# Patient Record
Sex: Female | Born: 1970 | Race: White | Hispanic: No | Marital: Married | Smoking: Never smoker
Health system: Southern US, Community
[De-identification: ages and names within clinical notes are randomized; demographics above are authoritative.]

## PROBLEM LIST (undated history)

## (undated) DIAGNOSIS — J45990 Exercise induced bronchospasm: Secondary | ICD-10-CM

## (undated) DIAGNOSIS — N921 Excessive and frequent menstruation with irregular cycle: Secondary | ICD-10-CM

## (undated) DIAGNOSIS — R102 Pelvic and perineal pain: Secondary | ICD-10-CM

## (undated) DIAGNOSIS — D251 Intramural leiomyoma of uterus: Secondary | ICD-10-CM

## (undated) DIAGNOSIS — F32A Depression, unspecified: Secondary | ICD-10-CM

## (undated) DIAGNOSIS — D509 Iron deficiency anemia, unspecified: Secondary | ICD-10-CM

## (undated) DIAGNOSIS — F411 Generalized anxiety disorder: Secondary | ICD-10-CM

## (undated) DIAGNOSIS — R399 Unspecified symptoms and signs involving the genitourinary system: Secondary | ICD-10-CM

## (undated) DIAGNOSIS — N920 Excessive and frequent menstruation with regular cycle: Secondary | ICD-10-CM

## (undated) DIAGNOSIS — G43909 Migraine, unspecified, not intractable, without status migrainosus: Secondary | ICD-10-CM

## (undated) DIAGNOSIS — F329 Major depressive disorder, single episode, unspecified: Secondary | ICD-10-CM

## (undated) DIAGNOSIS — Q5128 Other doubling of uterus, other specified: Secondary | ICD-10-CM

## (undated) DIAGNOSIS — F419 Anxiety disorder, unspecified: Secondary | ICD-10-CM

## (undated) DIAGNOSIS — Z8489 Family history of other specified conditions: Secondary | ICD-10-CM

## (undated) DIAGNOSIS — N3010 Interstitial cystitis (chronic) without hematuria: Secondary | ICD-10-CM

## (undated) HISTORY — DX: Anxiety disorder, unspecified: F41.9

## (undated) HISTORY — DX: Excessive and frequent menstruation with regular cycle: N92.0

## (undated) HISTORY — DX: Depression, unspecified: F32.A

---

## 1999-02-27 HISTORY — PX: MASS EXCISION: SHX2000

## 2004-02-27 HISTORY — PX: KNEE ARTHROSCOPY: SUR90

## 2005-08-08 ENCOUNTER — Inpatient Hospital Stay (HOSPITAL_COMMUNITY): Admission: AD | Admit: 2005-08-08 | Discharge: 2005-08-08 | Payer: Self-pay | Admitting: Obstetrics & Gynecology

## 2005-08-09 ENCOUNTER — Inpatient Hospital Stay (HOSPITAL_COMMUNITY): Admission: AD | Admit: 2005-08-09 | Discharge: 2005-08-09 | Payer: Self-pay | Admitting: Obstetrics and Gynecology

## 2005-09-15 ENCOUNTER — Inpatient Hospital Stay (HOSPITAL_COMMUNITY): Admission: AD | Admit: 2005-09-15 | Discharge: 2005-09-18 | Payer: Self-pay | Admitting: Obstetrics and Gynecology

## 2005-09-16 ENCOUNTER — Encounter (INDEPENDENT_AMBULATORY_CARE_PROVIDER_SITE_OTHER): Payer: Self-pay | Admitting: *Deleted

## 2005-09-16 HISTORY — PX: VAGINAL DILATATION: SHX322

## 2005-09-19 ENCOUNTER — Encounter: Admission: RE | Admit: 2005-09-19 | Discharge: 2005-10-19 | Payer: Self-pay | Admitting: Obstetrics and Gynecology

## 2005-10-20 ENCOUNTER — Encounter: Admission: RE | Admit: 2005-10-20 | Discharge: 2005-11-19 | Payer: Self-pay | Admitting: Obstetrics and Gynecology

## 2005-11-30 ENCOUNTER — Ambulatory Visit (HOSPITAL_COMMUNITY): Admission: RE | Admit: 2005-11-30 | Discharge: 2005-11-30 | Payer: Self-pay | Admitting: Obstetrics and Gynecology

## 2005-12-07 ENCOUNTER — Encounter: Admission: RE | Admit: 2005-12-07 | Discharge: 2005-12-07 | Payer: Self-pay | Admitting: Obstetrics and Gynecology

## 2006-11-25 ENCOUNTER — Ambulatory Visit (HOSPITAL_COMMUNITY): Admission: RE | Admit: 2006-11-25 | Discharge: 2006-11-25 | Payer: Self-pay | Admitting: *Deleted

## 2007-05-17 IMAGING — MR MR PELVIS W/O CM
4 of 6 series · 10 of 48 positions shown · IV contrast (agent unspecified)
Comparison: none

CLINICAL DATA: Uterine anomaly seen on hysterosalpingogram.  Question septate versus bicornuate uterus.  
MRI PELVIS WITHOUT CONTRAST:
TECHNIQUE: Multiplanar, multisequence MR imaging of the pelvis was performed following the standard protocol.  No intravenous contrast was administered.

[Series 2: t2_tse_sag · sagittal · 4.0mm · 0.44mm/px · 3 of 25 slices shown]
[im 3/25]
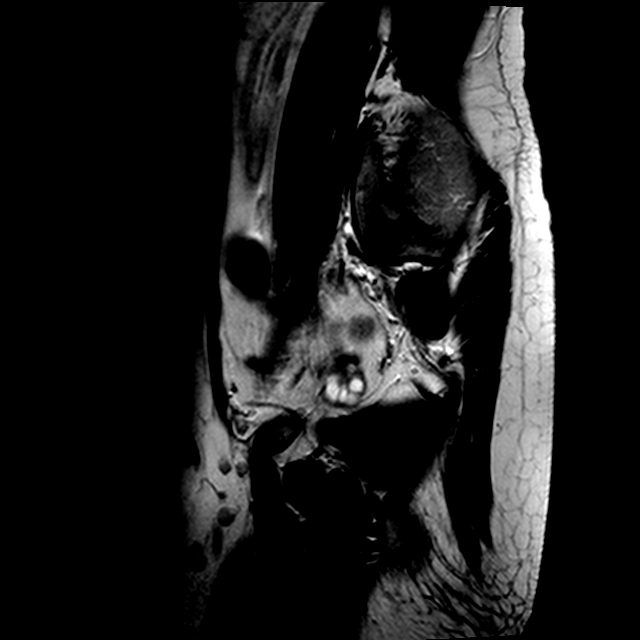
[im 14/25]
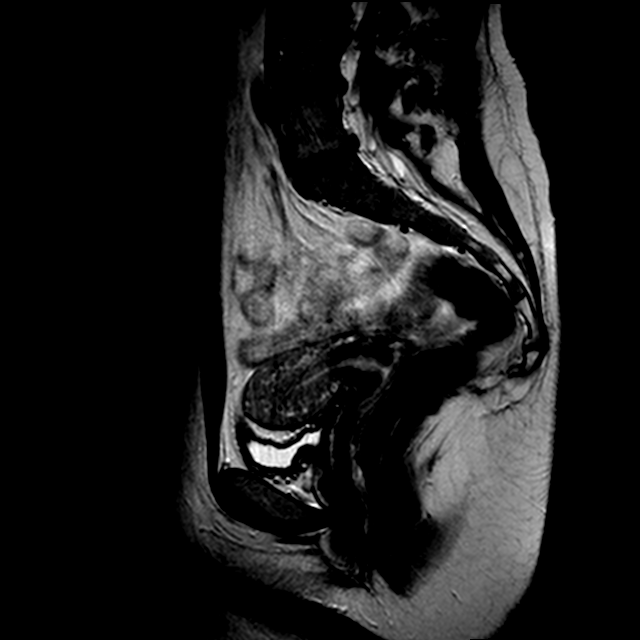
[im 22/25]
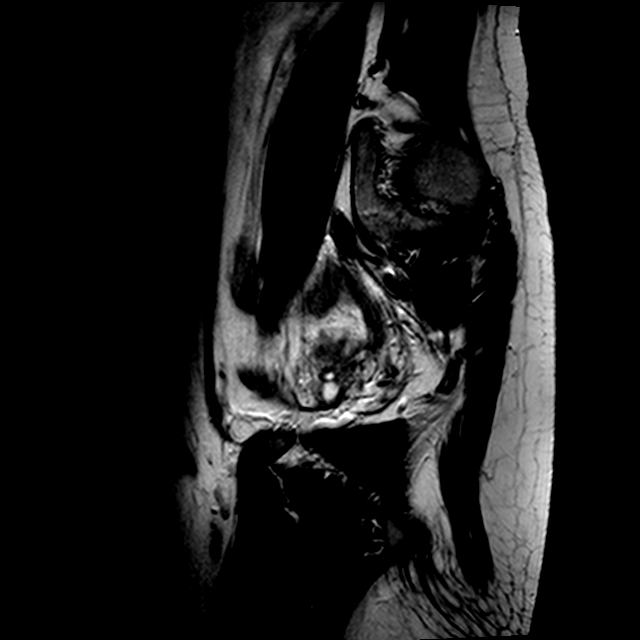

[Series 3: T1 · axial · 5.0mm · 0.36mm/px · z∈[-162,-64]mm · 3 of 20 slices shown]
[im 3/20]
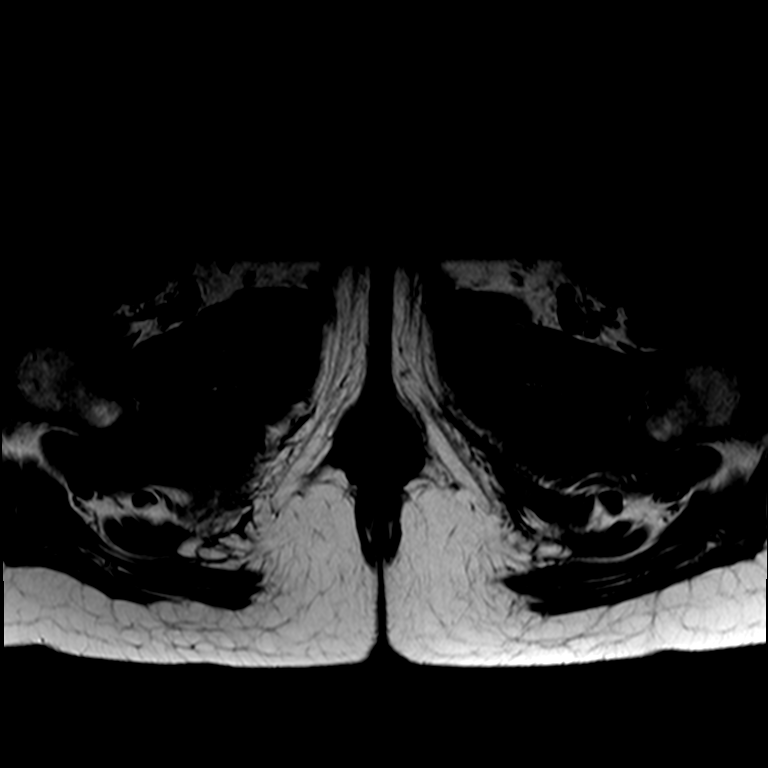
[im 11/20]
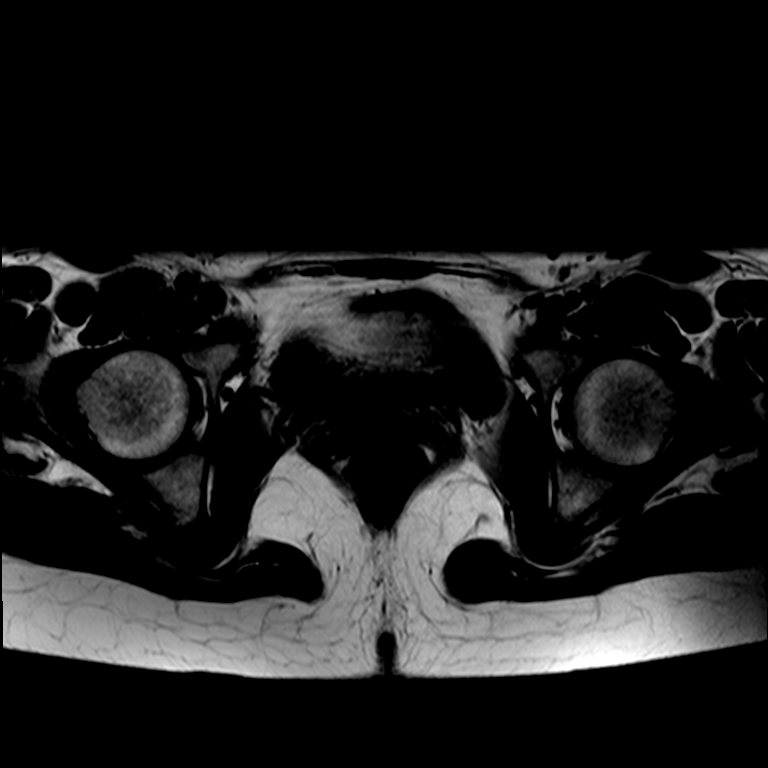
[im 17/20]
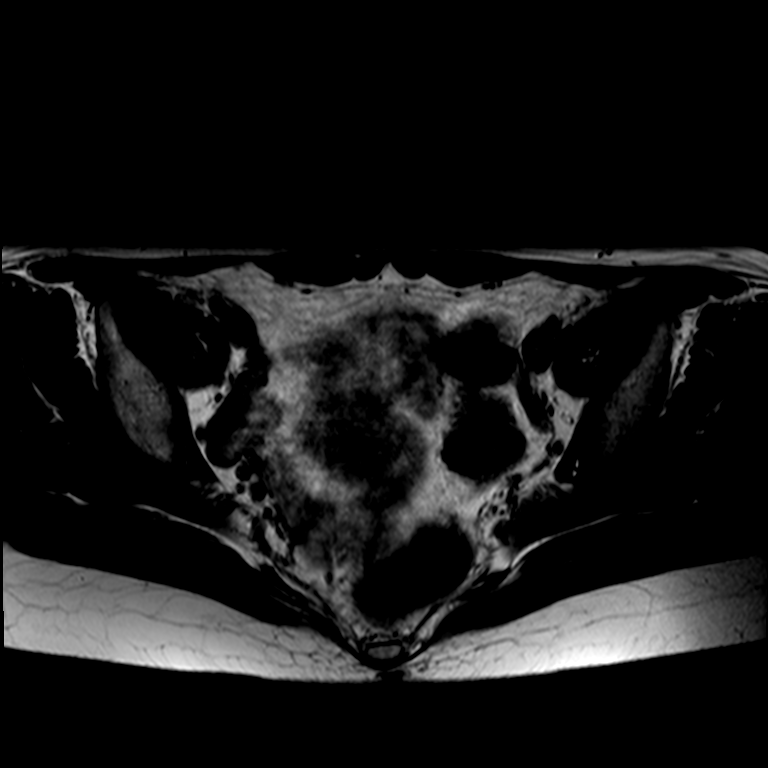

[Series 4: T1 fat-sat · axial · 5.0mm · 0.36mm/px · z∈[-155,-71]mm · 3 of 20 slices shown]
[im 4/20]
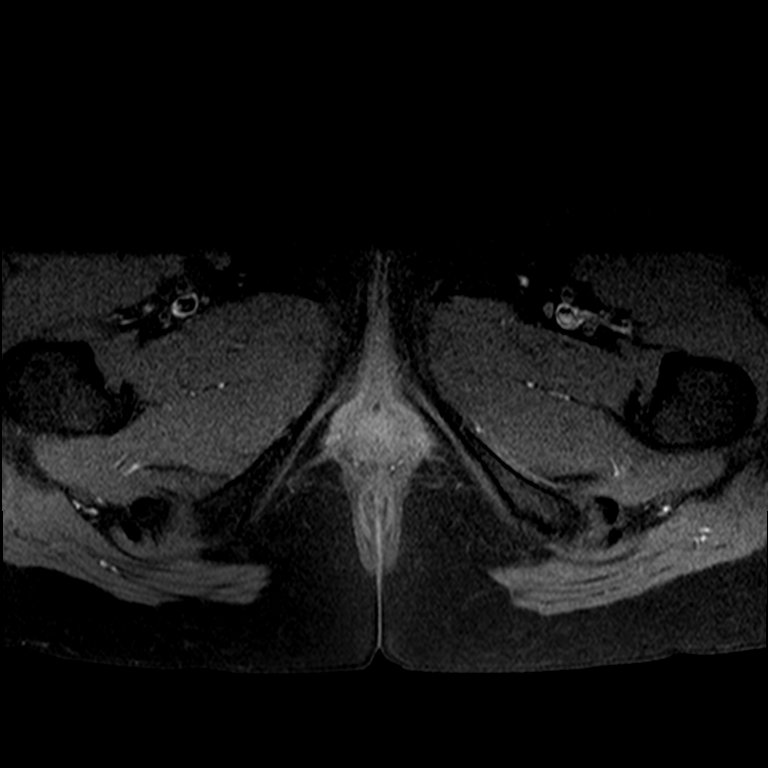
[im 10/20]
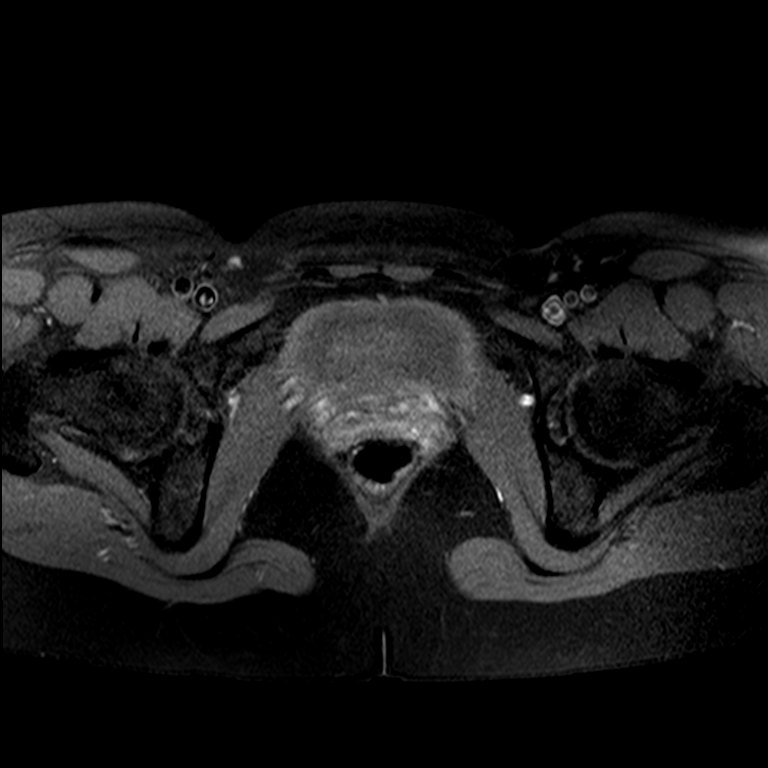
[im 16/20]
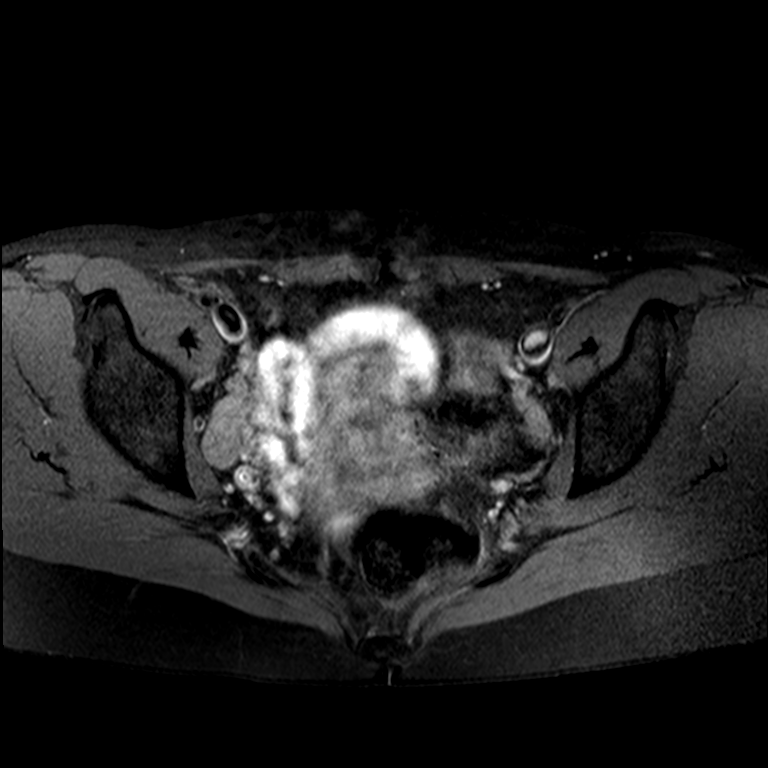

[Series 5: T2 · axial · 5.0mm · 0.36mm/px · 1 of 20 slices shown]
[im 4/20]
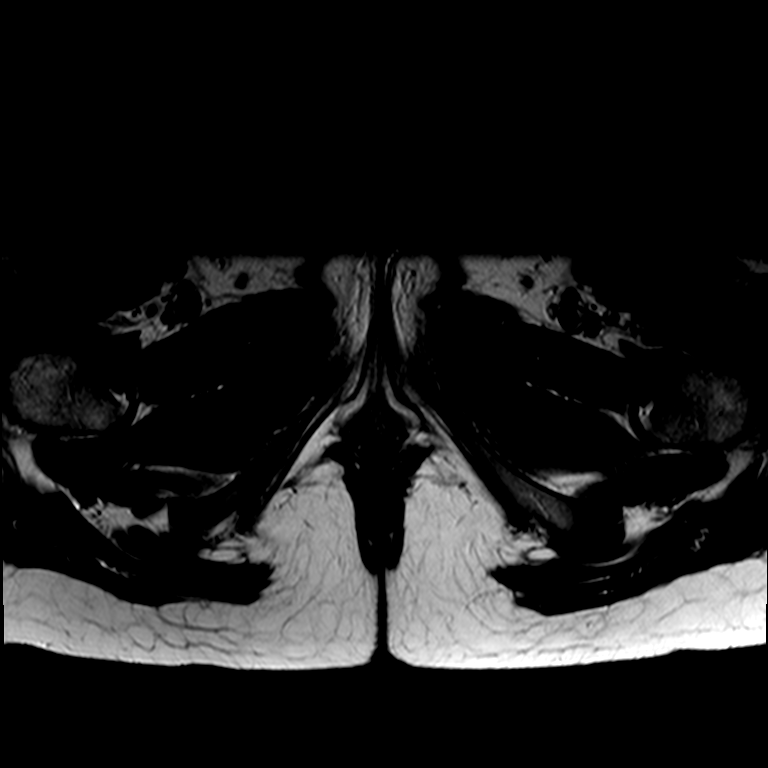

[10 of 48 positions shown; findings below may reference images not displayed]

FINDINGS: The uterus is normal in size, measuring approximately 7 cm in length x 3.5 cm in AP diameter x 6 cm in transverse diameter.  The endometrial cavity is split by a septum of myometrial tissue in the upper uterine body and fundus, however there is contiguous bridging myometrium across the fundus of the uterus.  This is consistent with a septate uterus.  Intercornual distance measures 3.7 cm.  A single cervix is seen.  No fibroids or other uterine abnormalities are identified.  
Both ovaries are normal in appearance.  There is no evidence of adnexal or other pelvic masses.
IMPRESSION: 1.  Septate uterus.  
2.  Normal ovaries.  No other pelvic pathology identified.

## 2007-11-13 ENCOUNTER — Encounter: Admission: RE | Admit: 2007-11-13 | Discharge: 2007-11-13 | Payer: Self-pay | Admitting: Specialist

## 2008-05-04 IMAGING — US US RENAL
1 series · 14 of 25 positions shown · non-contrast
Comparison: none

CLINICAL DATA: Evaluate for kidney stones. Left flank pain.
 RENAL ULTRASOUND:
TECHNIQUE: Multiple images of both kidneys were obtained.

[Series 1: us renal · 0.18mm/px · 14 of 51 slices shown]
[im 1/51]
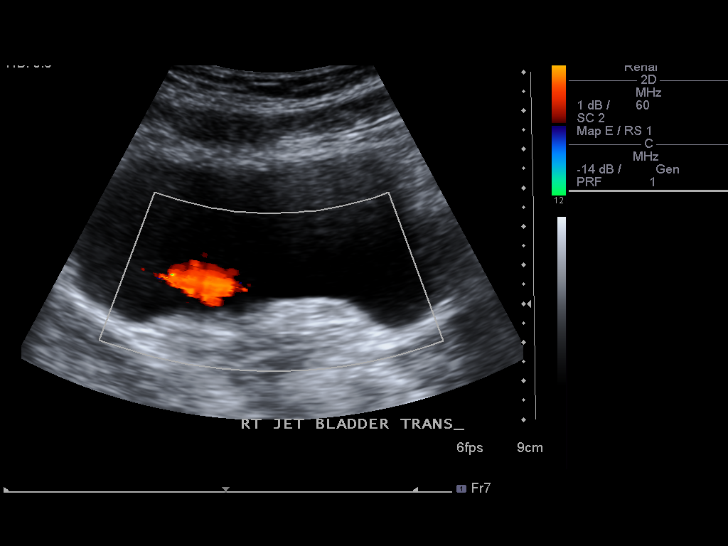
[im 5/51]
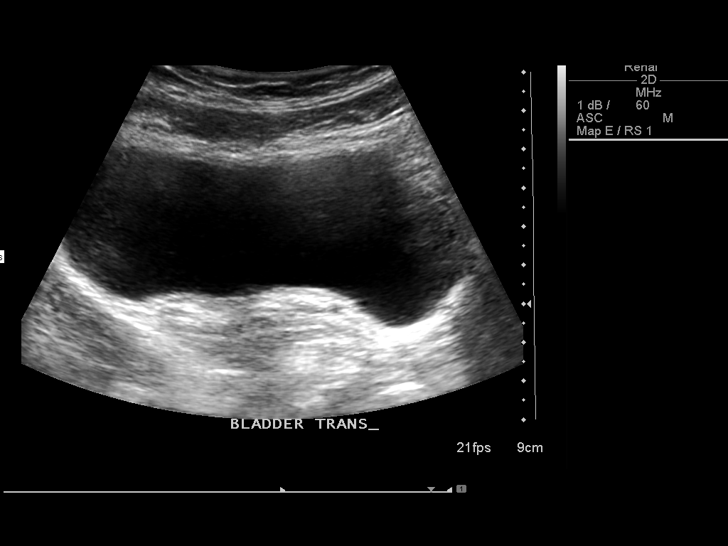
[im 9/51]
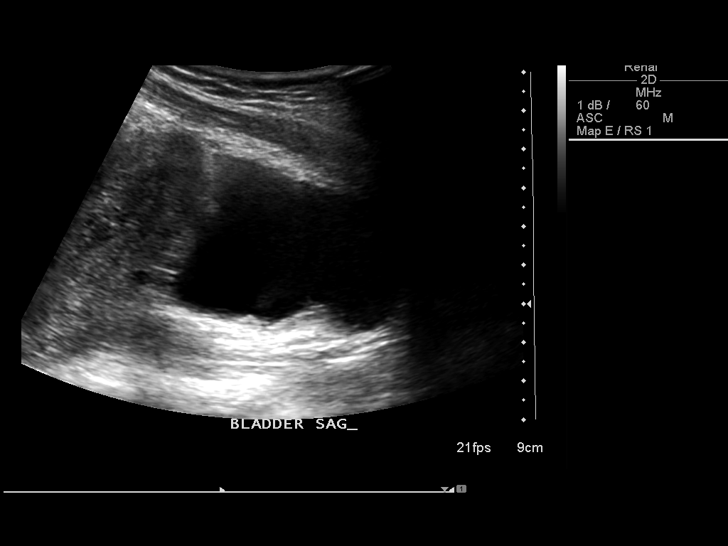
[im 13/51]
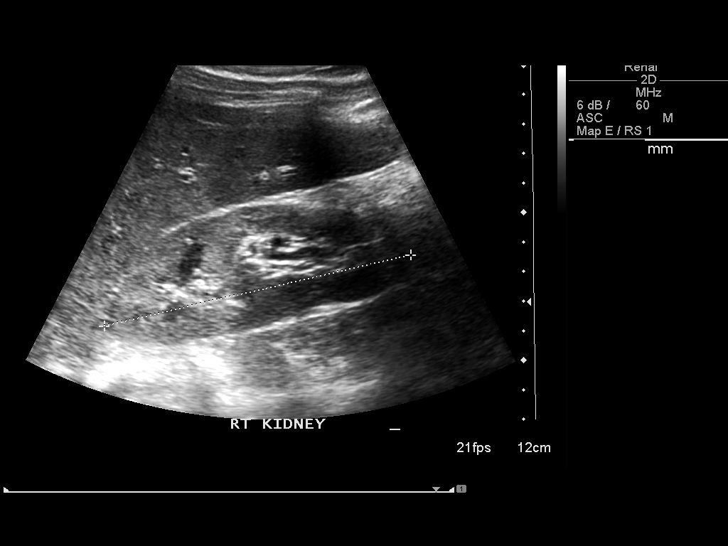
[im 17/51]
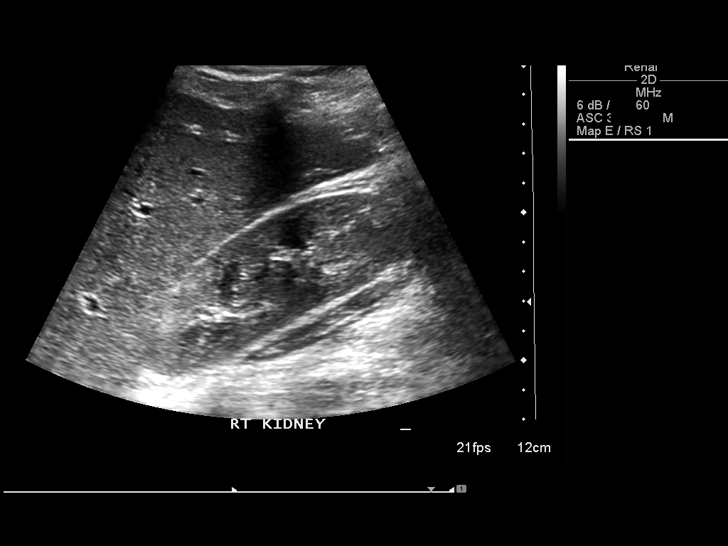
[im 19/51]
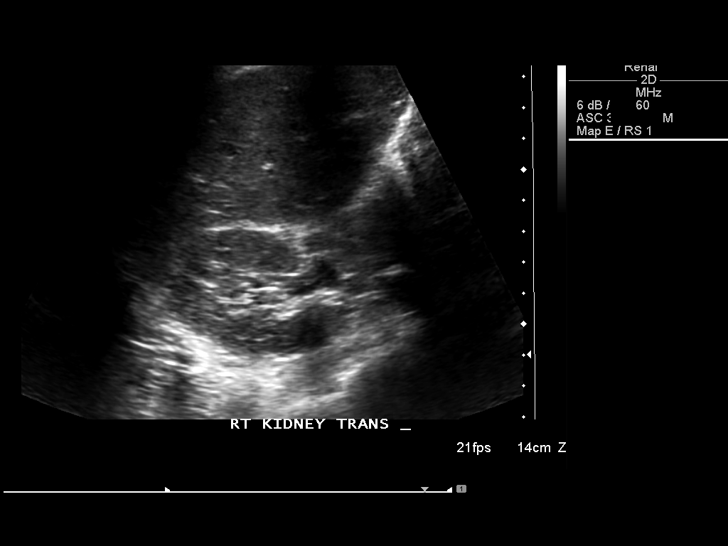
[im 23/51]
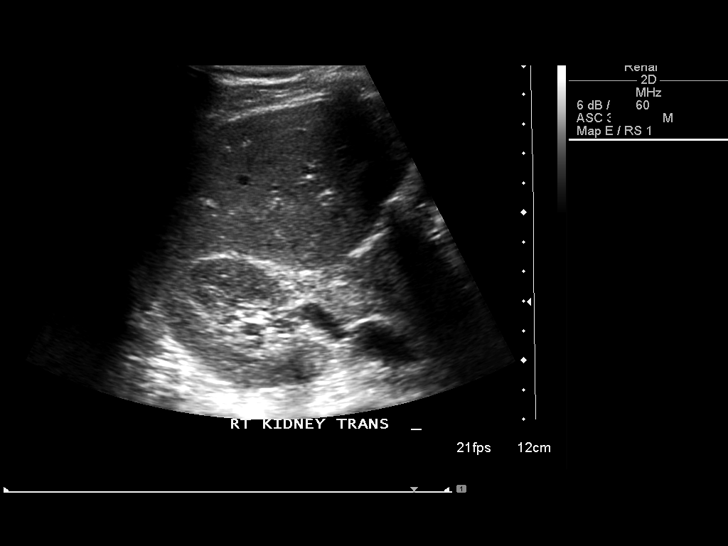
[im 28/51]
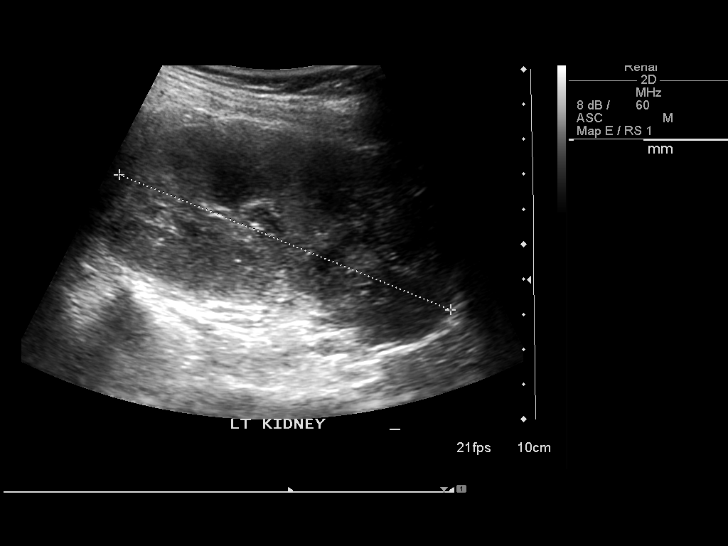
[im 32/51]
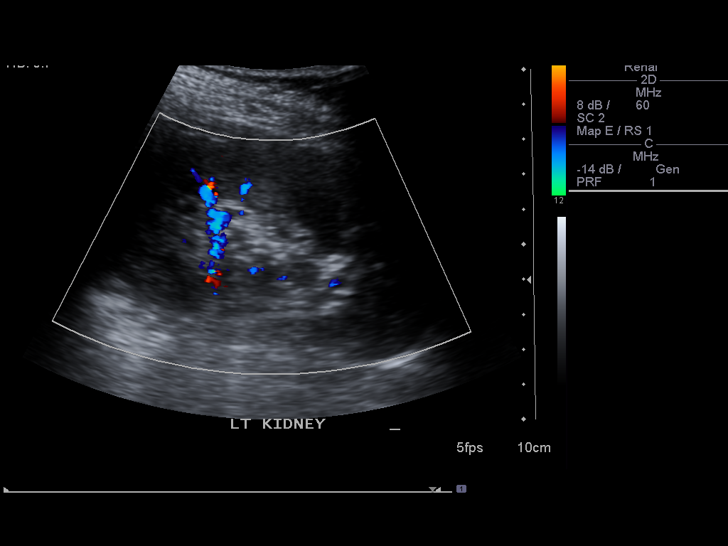
[im 34/51]
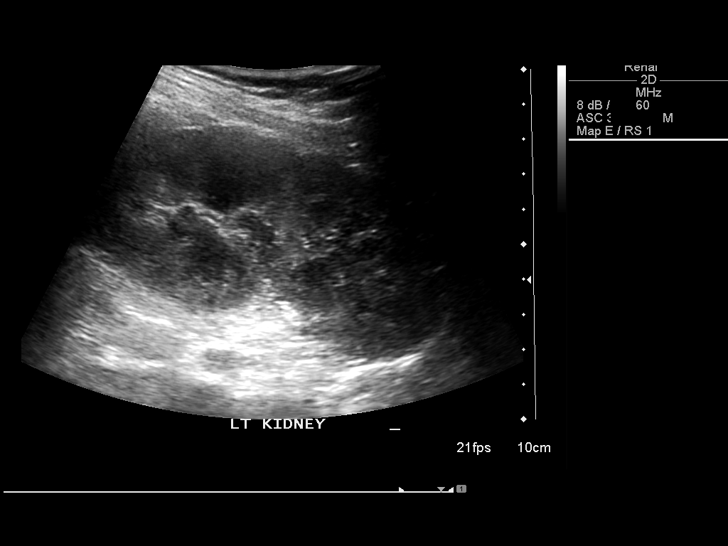
[im 38/51]
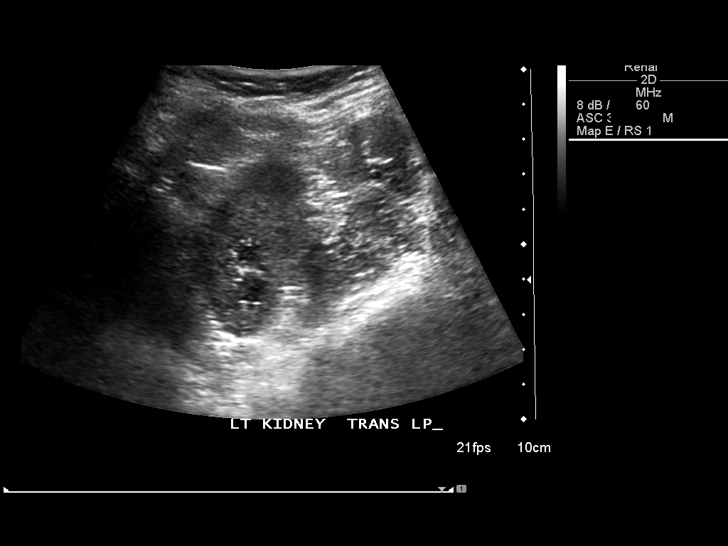
[im 42/51]
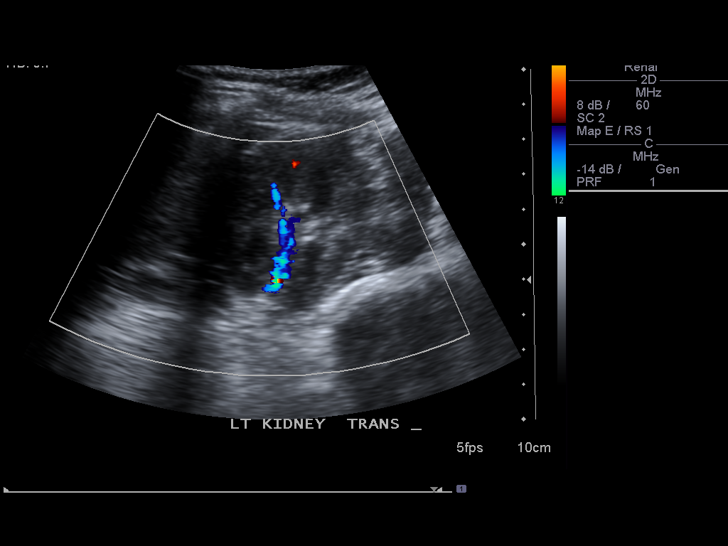
[im 46/51]
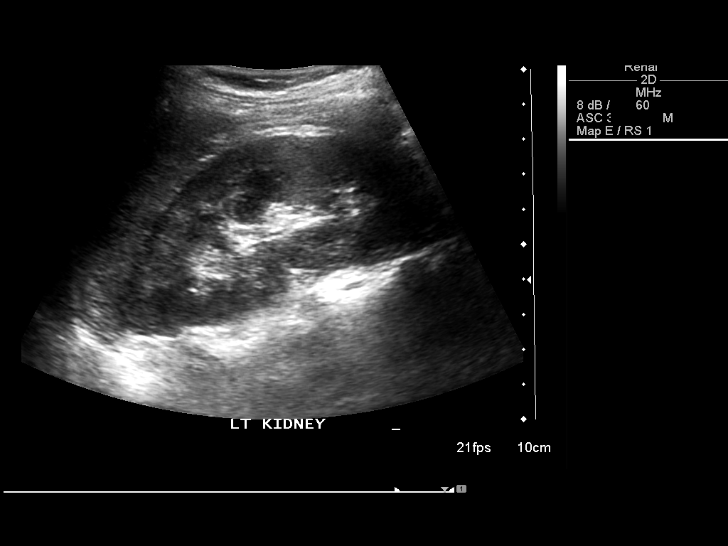
[im 51/51]
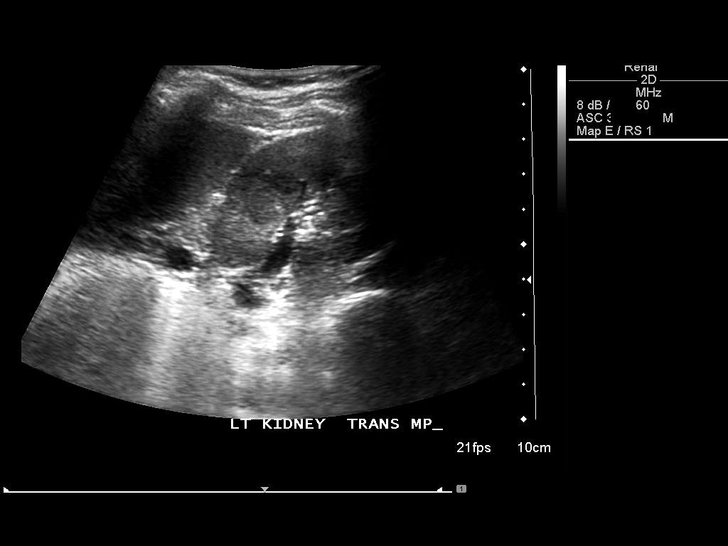

[14 of 25 positions shown; findings below may reference images not displayed]

FINDINGS: The right kidney has a sagittal length of 10.6 cm and the left kidney has a sagittal length of 10.2 cm.  No signs of hydronephrosis are seen. No focal parenchymal abnormalities are identified. Specifically, no sonographic findings are seen to suggest the presence of renal calculi. No perinephric fluid is seen. It should be noted that the patient did experience flank tenderness with scanning over the left kidney.  
 Evaluation of the bladder reveals strong bilateral ureteral jets. The bladder has a normal filled appearance.
IMPRESSION: Normal renal ultrasound. The patient did experience tenderness with scanning over the left flank area.  If clinical concern warrants, further evaluation can be undertaken with CT as this is a more sensitive way to evaluate for a small renal calculi.

## 2009-04-22 IMAGING — US US EXTREM LOW VENOUS*R*
1 series · 14 of 24 positions shown · non-contrast
Comparison: None

CLINICAL DATA: Right leg pain and swelling.  Question deep venous
thrombosis.



[Series 1: us extrem low venous*right* · 14 of 30 slices shown]
[im 1/30]
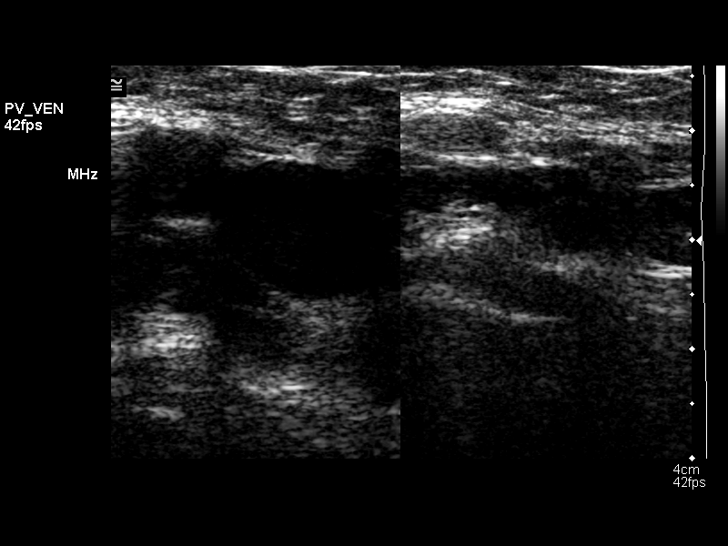
[im 3/30]
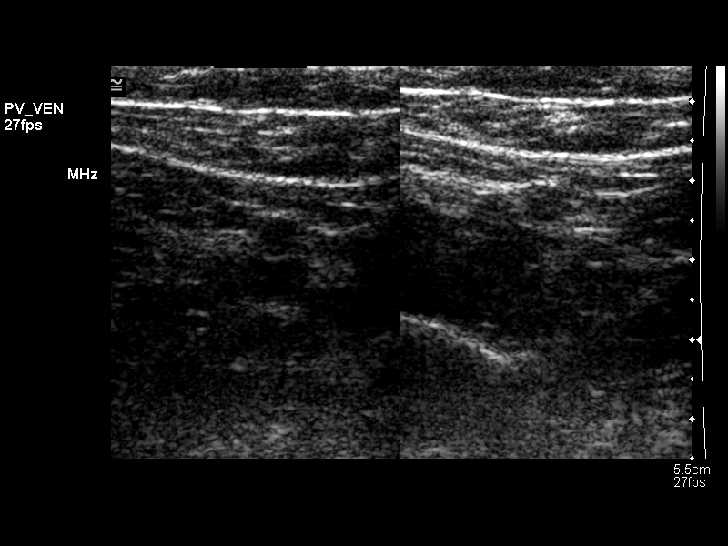
[im 6/30]
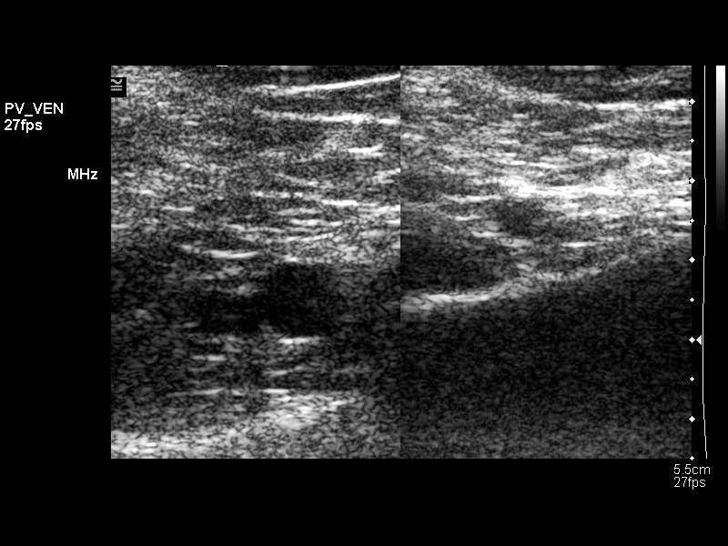
[im 8/30]
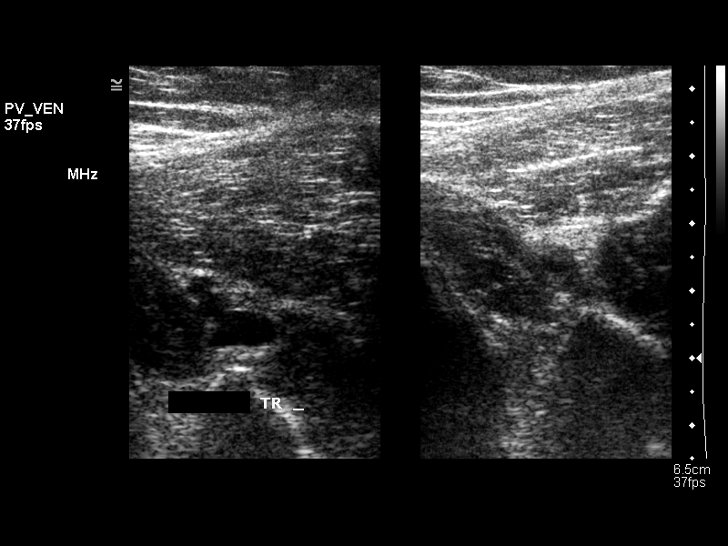
[im 9/30]
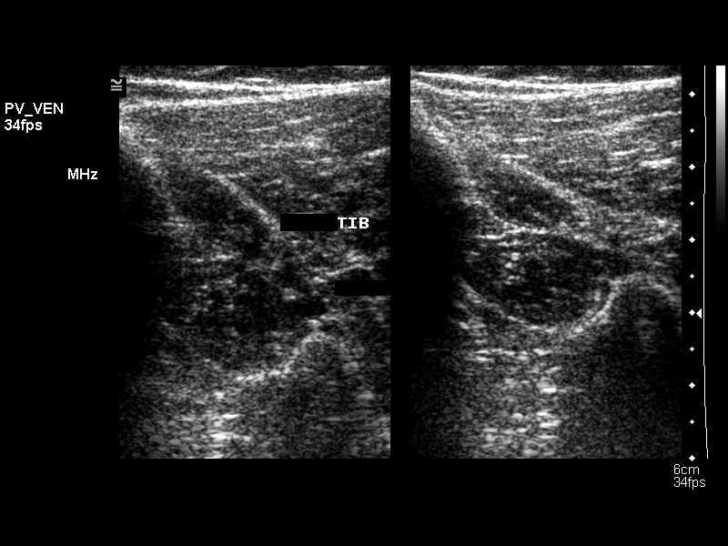
[im 12/30]
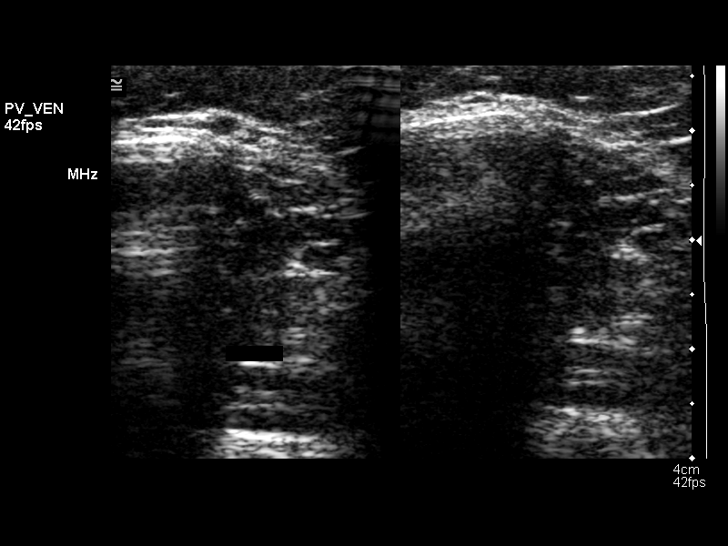
[im 14/30]
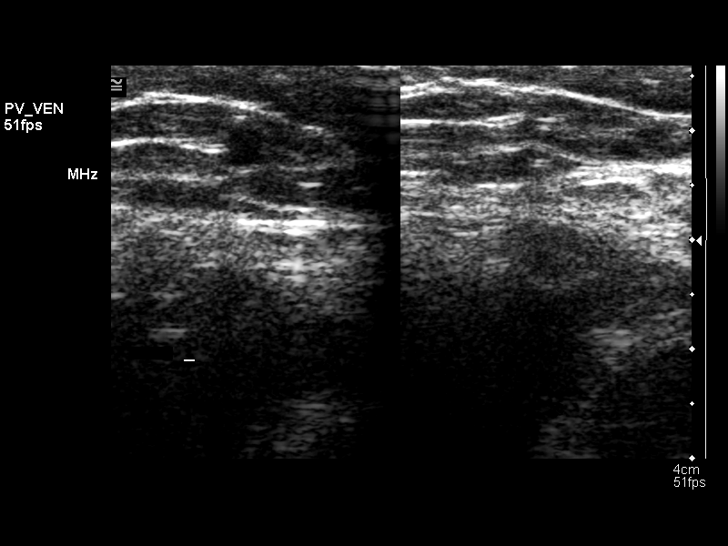
[im 16/30]
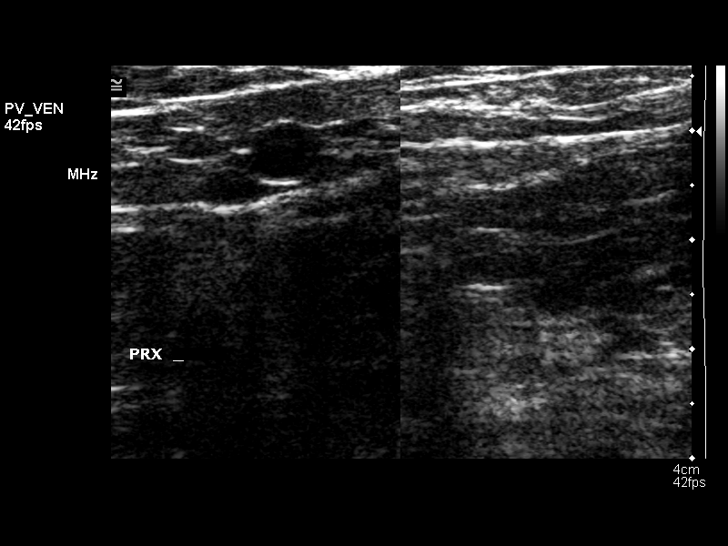
[im 18/30]
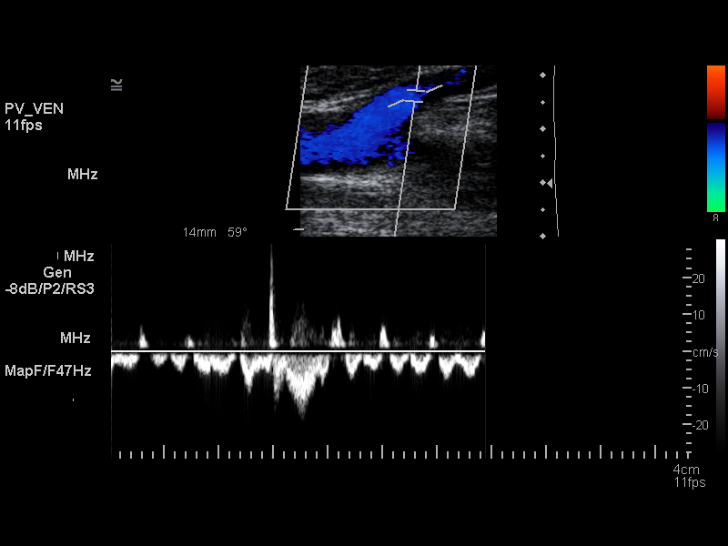
[im 21/30]
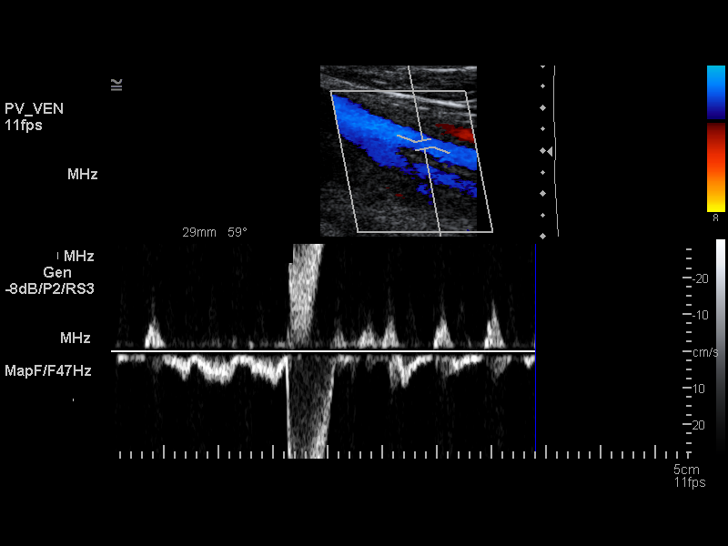
[im 23/30]
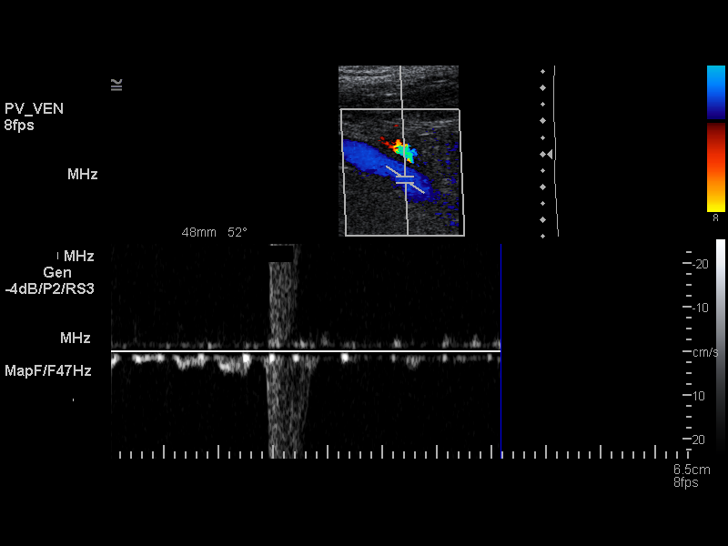
[im 24/30]
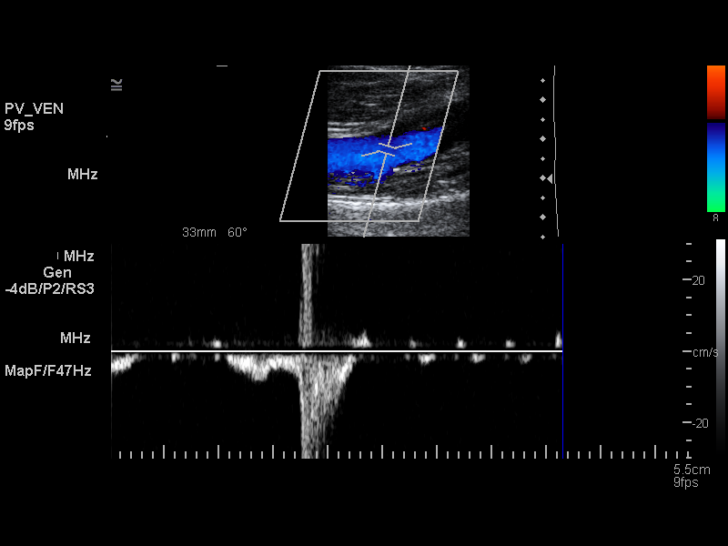
[im 27/30]
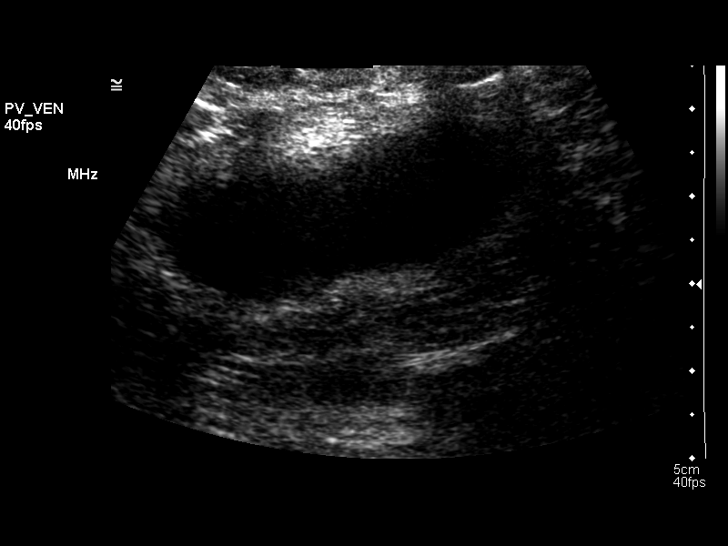
[im 30/30]
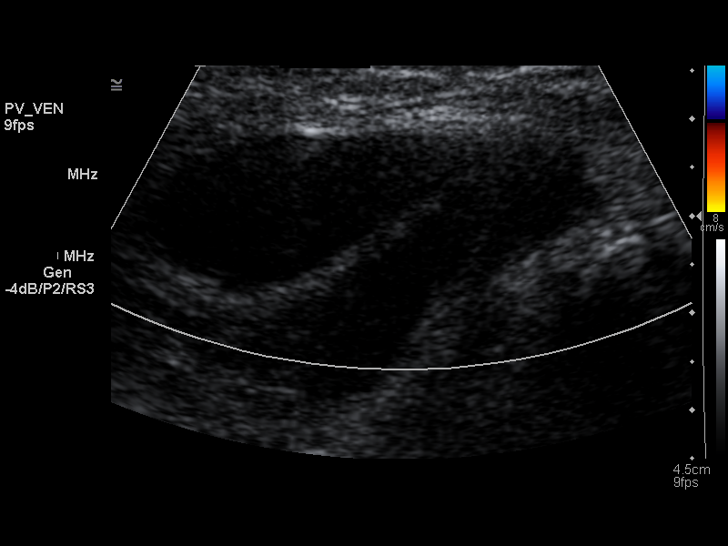

[14 of 24 positions shown; findings below may reference images not displayed]

FINDINGS: Normal compressibility of  the right common femoral,
superficial femoral, and popliteal veins is demonstrated, as well
as the visualized proximal calf veins.  No filling defects to
suggest DVT on grayscale or color Doppler imaging.  Doppler
waveforms show normal direction of venous flow, normal respiratory
phasicity and response to augmentation. At the posterior medial
popliteal space at this finding consistent with slightly complex
popliteal cyst measuring 4.9 cm long X 1.7 cm AP X 4.5 cm wide.
IMPRESSION: 1.  No evidence of  right lower extremity deep vein thrombosis.
2.  Posterior medial popliteal slightly complex cyst.

## 2010-04-17 ENCOUNTER — Other Ambulatory Visit: Payer: Self-pay | Admitting: Obstetrics and Gynecology

## 2010-07-14 NOTE — Op Note (Signed)
NAMERETAJ, HILBUN NO.:  [id number]   MEDICAL RECORD NO.:  [id number]          PATIENT TYPE:  INP   LOCATION:  9305                          FACILITY:  [facility]   PHYSICIAN:  Lenoard Aden, M.D.DATE OF BIRTH:  16-Sep-1970   DATE OF PROCEDURE:  DATE OF DISCHARGE:                                 OPERATIVE REPORT   PREOPERATIVE DIAGNOSIS:  Retained placenta.   POSTOPERATIVE DIAGNOSIS:  Retained placenta.   PROCEDURE:  Manual extraction of the placenta with dilatation of the cervix,  a sponge curettage.   SURGEON:  Lenoard Aden, M.D.   ANESTHESIA:  Epidural.   ESTIMATED BLOOD LOSS:  Less than 100 cc.   COMPLICATIONS:  None.   DRAINS:  None.   COUNTS:  Correct.   Patient recovering in good condition.   BRIEF OPERATIVE NOTE:  After an uncomplicated vaginal delivery, __________  the patient with a retained placenta noted.  Manual extraction is attempted  after gentle dilatation of the cervix without difficulty.  After the cervix  is dilated adequately, manual extraction is accomplished in a standard  fashion by shelling out of the placenta off the uterine fundus.  There is no  evidence of accreta.  No evidence of uterine inversion.  No evidence of  cervical or vaginal lacerations noted.  After extraction of the placenta,  the placenta is examined and found to be intact.  The cervix is intact  without any evidence of laceration.  No vaginal lacerations noted.  Estimated blood loss, approximately 100 cc noted.  The patient tolerated the  procedure well and went to recovery in good condition.      Lenoard Aden, M.D.  Electronically Signed     RJT/MEDQ  D:  09/16/2005  T:  09/16/2005  Job:  [id number]   cc:   [facility]

## 2015-12-12 ENCOUNTER — Other Ambulatory Visit: Payer: Self-pay

## 2015-12-12 ENCOUNTER — Encounter (HOSPITAL_COMMUNITY): Payer: Self-pay

## 2015-12-12 ENCOUNTER — Emergency Department (HOSPITAL_COMMUNITY): Payer: BC Managed Care – PPO

## 2015-12-12 ENCOUNTER — Emergency Department (HOSPITAL_COMMUNITY)
Admission: EM | Admit: 2015-12-12 | Discharge: 2015-12-12 | Disposition: A | Discharge status: Home / Self Care | Payer: BC Managed Care – PPO | Attending: Emergency Medicine | Admitting: Emergency Medicine

## 2015-12-12 DIAGNOSIS — R071 Chest pain on breathing: Secondary | ICD-10-CM | POA: Diagnosis present

## 2015-12-12 DIAGNOSIS — J209 Acute bronchitis, unspecified: Secondary | ICD-10-CM | POA: Diagnosis not present

## 2015-12-12 LAB — BASIC METABOLIC PANEL
Anion gap: 12 (ref 5–15)
BUN: 6 mg/dL (ref 6–20)
CHLORIDE: 105 mmol/L (ref 101–111)
CO2: 21 mmol/L — AB (ref 22–32)
CREATININE: 0.9 mg/dL (ref 0.44–1.00)
Calcium: 9.5 mg/dL (ref 8.9–10.3)
GFR calc non Af Amer: >60 mL/min (ref >60)
Glucose, Bld: 107 mg/dL — ABNORMAL HIGH (ref 65–99)
POTASSIUM: 2.7 mmol/L — AB (ref 3.5–5.1)
SODIUM: 138 mmol/L (ref 135–145)

## 2015-12-12 LAB — CBC
HEMATOCRIT: 40.7 % (ref 36.0–46.0)
Hemoglobin: 13.6 g/dL (ref 12.0–15.0)
MCH: 29.6 pg (ref 26.0–34.0)
MCHC: 33.4 g/dL (ref 30.0–36.0)
MCV: 88.7 fL (ref 78.0–100.0)
Platelets: 279 10*3/uL (ref 150–400)
RBC: 4.59 MIL/uL (ref 3.87–5.11)
RDW: 12.2 % (ref 11.5–15.5)
WBC: 9.6 10*3/uL (ref 4.0–10.5)

## 2015-12-12 LAB — I-STAT TROPONIN, ED: Troponin i, poc: 0 ng/mL (ref 0.00–0.08)

## 2015-12-12 LAB — D-DIMER, QUANTITATIVE (NOT AT ARMC): D DIMER QUANT: 0.29 ug{FEU}/mL (ref 0.00–0.50)

## 2015-12-12 MED ORDER — PREDNISONE 20 MG PO TABS
60.0000 mg | ORAL_TABLET | Freq: Once | ORAL | Status: AC
  Administered 2015-12-12: 60 mg via ORAL
  Filled 2015-12-12: qty 3

## 2015-12-12 MED ORDER — ALBUTEROL SULFATE HFA 108 (90 BASE) MCG/ACT IN AERS
2.0000 | INHALATION_SPRAY | RESPIRATORY_TRACT | Status: DC | PRN
  Filled 2015-12-12: qty 6.7

## 2015-12-12 MED ORDER — IPRATROPIUM BROMIDE 0.02 % IN SOLN
0.5000 mg | Freq: Once | RESPIRATORY_TRACT | Status: AC
  Administered 2015-12-12: 0.5 mg via RESPIRATORY_TRACT
  Filled 2015-12-12: qty 2.5

## 2015-12-12 MED ORDER — ALBUTEROL SULFATE (2.5 MG/3ML) 0.083% IN NEBU
5.0000 mg | INHALATION_SOLUTION | Freq: Once | RESPIRATORY_TRACT | Status: AC
  Administered 2015-12-12: 5 mg via RESPIRATORY_TRACT
  Filled 2015-12-12: qty 6

## 2015-12-12 MED ORDER — IPRATROPIUM-ALBUTEROL 0.5-2.5 (3) MG/3ML IN SOLN
3.0000 mL | Freq: Once | RESPIRATORY_TRACT | Status: AC
  Administered 2015-12-12: 3 mL via RESPIRATORY_TRACT
  Filled 2015-12-12: qty 3

## 2015-12-12 MED ORDER — PREDNISONE 50 MG PO TABS: 50.0000 mg | ORAL_TABLET | Freq: Every day | ORAL | 0 refills | Status: DC

## 2015-12-12 MED ORDER — POTASSIUM CHLORIDE CRYS ER 20 MEQ PO TBCR
40.0000 meq | EXTENDED_RELEASE_TABLET | Freq: Once | ORAL | Status: AC
  Administered 2015-12-12: 40 meq via ORAL
  Filled 2015-12-12: qty 2

## 2015-12-12 MED ORDER — POTASSIUM CHLORIDE 10 MEQ/100ML IV SOLN
10.0000 meq | Freq: Once | INTRAVENOUS | Status: AC
  Administered 2015-12-12: 10 meq via INTRAVENOUS
  Filled 2015-12-12: qty 100

## 2015-12-12 NOTE — ED Provider Notes (Signed)
[facility] DEPT Provider Note   CSN: [id number] Arrival date & time: 12/12/15  1844     History   Chief Complaint Chief Complaint  Patient presents with  . Chest Pain    pt sent over for evaluation of chest pain r/t cough     HPI Connie Wheeler is a 45 y.o. female.  HPI  Pt presenting with c/o cough and chest pain.  She states that over the past 2 nights she has had pain in her chest with coughing.  Today she felt that it was difficult to breathe while she was at work, up moving around.  No fever/chills.  Cough is nonproductive.  No leg swelling.  She has had no vomiting, states she has been drinking liquids well.  She was seen at an urgent care and transferred to the ED due to concern for pneumonia per patient.  Pt does take estradiol, no hx of DVT/PE, no recent travel/trauma/surgery.  There are no other associated systemic symptoms, there are no other alleviating or modifying factors.   History reviewed. No pertinent past medical history.  There are no active problems to display for this patient.   History reviewed. No pertinent surgical history.  OB History    No data available       Home Medications    Prior to Admission medications   Medication Sig Start Date End Date Taking? Authorizing Provider  hydrOXYzine (ATARAX/VISTARIL) 25 MG tablet Take 25 mg by mouth at bedtime.   Yes Historical Provider, MD  levonorgestrel-ethinyl estradiol (ENPRESSE,TRIVORA) tablet Take 1 tablet by mouth daily.   Yes Historical Provider, MD  pentosan polysulfate (ELMIRON) 100 MG capsule Take 100 mg by mouth 3 (three) times daily.   Yes Historical Provider, MD  predniSONE (DELTASONE) 50 MG tablet Take 1 tablet (50 mg total) by mouth daily. 12/12/15   Alfonzo Beers, MD    Family History No family history on file.  Social History Social History  Substance Use Topics  . Smoking status: Never Smoker  . Smokeless tobacco: Never Used  . Alcohol use No     Allergies   Review of  patient's allergies indicates no known allergies.   Review of Systems Review of Systems  ROS reviewed and all otherwise negative except for mentioned in HPI   Physical Exam Updated Vital Signs BP 120/79   Pulse 109   Temp 98.5 F (36.9 C) (Oral)   Resp (!) 27   Ht 5\' 6"  (1.676 m)   Wt 71.7 kg   LMP 12/05/2015   SpO2 97%   BMI 25.50 kg/m  Vitals reviewed Physical Exam Physical Examination: General appearance - alert, well appearing, and in no distress Mental status - alert, oriented to person, place, and time Eyes - no conjunctival injection no scleral icterus Mouth - mucous membranes moist, pharynx normal without lesions Neck - supple, no significant adenopathy Chest - BSS, bilateral course wheezing throughout- improved after duoneb Heart - normal rate, regular rhythm, normal S1, S2, no murmurs, rubs, clicks or gallops Abdomen - soft, nontender, nondistended, no masses or organomegaly  Neurological - alert, oriented, normal speech, no focal findings or movement disorder noted Extremities - peripheral pulses normal, no pedal edema, no clubbing or cyanosis Skin - normal coloration and turgor, no rashes  ED Treatments / Results  Labs (all labs ordered are listed, but only abnormal results are displayed) Labs Reviewed  BASIC METABOLIC PANEL - Abnormal; Notable for the following:       Result Value  Potassium 2.7 (*)    CO2 21 (*)    Glucose, Bld 107 (*)    All other components within normal limits  CBC  D-DIMER, QUANTITATIVE (NOT AT [facility])  I-STAT TROPOININ, ED    EKG  EKG Interpretation None     ED ECG REPORT   Date: 12/12/2015  Rate: 87  Rhythm: normal sinus rhythm  QRS Axis: normal  Intervals: normal  ST/T Wave abnormalities: normal  Conduction Disutrbances:poor r wave progression  Narrative Interpretation:   Old EKG Reviewed: none available  EKG link not available in epic for interpretation into muse  Radiology Dg Chest 2 View  Result Date:  12/12/2015 CLINICAL DATA:  Cough and congestion with chest pain EXAM: CHEST  2 VIEW COMPARISON:  12/12/2015 FINDINGS: The heart size and mediastinal contours are within normal limits. Both lungs are clear. The visualized skeletal structures are unremarkable. IMPRESSION: No active cardiopulmonary disease. Electronically Signed   By: Inez Catalina M.D.   On: 12/12/2015 21:52    Procedures Procedures (including critical care time)  Medications Ordered in ED Medications  albuterol (PROVENTIL HFA;VENTOLIN HFA) 108 (90 Base) MCG/ACT inhaler 2 puff (not administered)  ipratropium-albuterol (DUONEB) 0.5-2.5 (3) MG/3ML nebulizer solution 3 mL (3 mLs Nebulization Given 12/12/15 1855)  potassium chloride 10 mEq in 100 mL IVPB (0 mEq Intravenous Stopped 12/12/15 2221)  potassium chloride SA (K-DUR,KLOR-CON) CR tablet 40 mEq (40 mEq Oral Given 12/12/15 2119)  albuterol (PROVENTIL) (2.5 MG/3ML) 0.083% nebulizer solution 5 mg (5 mg Nebulization Given 12/12/15 2250)  ipratropium (ATROVENT) nebulizer solution 0.5 mg (0.5 mg Nebulization Given 12/12/15 2251)  predniSONE (DELTASONE) tablet 60 mg (60 mg Oral Given 12/12/15 2250)     Initial Impression / Assessment and Plan / ED Course  I have reviewed the triage vital signs and the nursing notes.  Pertinent labs & imaging results that were available during my care of the patient were reviewed by me and considered in my medical decision making (see chart for details).  Clinical Course    Pt presenting with c/o shortness of breath and cough- on arrival to the ED she is wheezing.  There was some concern for pneumonia on CXR at urgent care- we do not have those records- CXR here is reassuring without infiltrate.  Pt has reassuring EKG, negative troponin and d-dimer.  Pt feels improved after duoneb x 2.  Will start on prednisone and given albuterol MDI.    Final Clinical Impressions(s) / ED Diagnoses   Final diagnoses:  Bronchitis with bronchospasm    New  Prescriptions Discharge Medication List as of 12/12/2015 11:24 PM    START taking these medications   Details  predniSONE (DELTASONE) 50 MG tablet Take 1 tablet (50 mg total) by mouth daily., Starting Mon 12/12/2015, Print         Alfonzo Beers, MD 12/13/15 0000

## 2015-12-12 NOTE — Discharge Instructions (Signed)
Return to the ED with any concerns including difficulty breathing despite using albuterol every 4 hours, not drinking fluids, decreased urine output, vomiting and not able to keep down liquids or medications, decreased level of alertness/lethargy, or any other alarming symptoms °

## 2015-12-12 NOTE — ED Triage Notes (Signed)
CXR at urgent care showed pneumonia on the R

## 2018-09-11 ENCOUNTER — Other Ambulatory Visit: Payer: Self-pay | Admitting: Certified Nurse Midwife

## 2018-09-11 DIAGNOSIS — Q5128 Other doubling of uterus, other specified: Secondary | ICD-10-CM

## 2018-09-15 ENCOUNTER — Other Ambulatory Visit: Payer: Self-pay

## 2018-09-15 ENCOUNTER — Ambulatory Visit
Admission: RE | Admit: 2018-09-15 | Discharge: 2018-09-15 | Disposition: A | Discharge status: Home / Self Care | Payer: BC Managed Care – PPO | Source: Ambulatory Visit | Attending: Certified Nurse Midwife | Admitting: Certified Nurse Midwife

## 2018-09-15 DIAGNOSIS — Q5128 Other doubling of uterus, other specified: Secondary | ICD-10-CM

## 2018-09-15 MED ORDER — GADOBENATE DIMEGLUMINE 529 MG/ML IV SOLN
15.0000 mL | Freq: Once | INTRAVENOUS | Status: AC | PRN
  Administered 2018-09-15: 15 mL via INTRAVENOUS

## 2020-02-23 IMAGING — MR MRI PELVIS WITHOUT AND WITH CONTRAST
7 of 11 series · 33 of 48 positions shown · IV contrast (multihance)
Comparison: 12/07/2005

CLINICAL DATA: Menorrhagia. Uterine mass on outside
sonohysterogram. Septate uterus.

EXAM:
MRI PELVIS WITHOUT AND WITH CONTRAST
TECHNIQUE: Multiplanar multisequence MR imaging of the pelvis was performed
both before and after administration of intravenous contrast.
CONTRAST:  15mL MULTIHANCE GADOBENATE DIMEGLUMINE 529 MG/ML IV SOLN

[Series 2: cor haste · coronal · 6.0mm · 0.78mm/px · 3 of 25 slices shown]
[im 1/25]
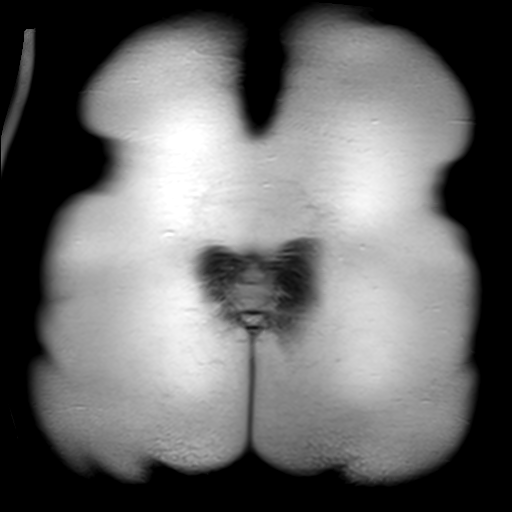
[im 13/25]
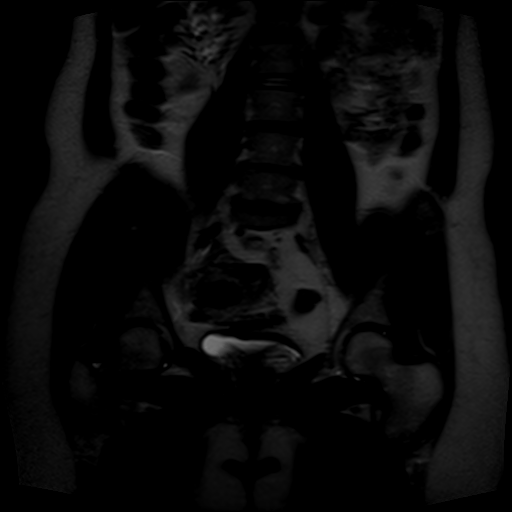
[im 25/25]
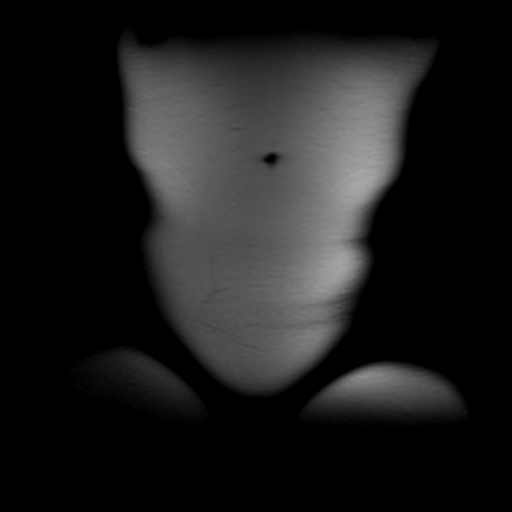

[Series 3: t2_tse_sag · sagittal · 5.0mm · 1.05mm/px · 5 of 30 slices shown]
[im 1/30]
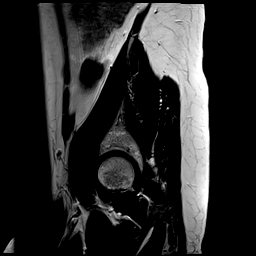
[im 8/30]
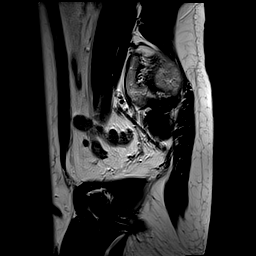
[im 15/30]
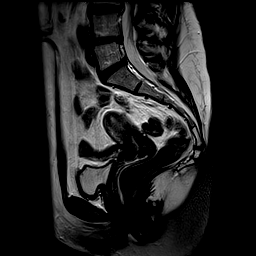
[im 22/30]
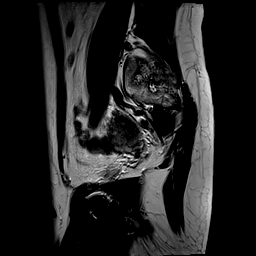
[im 30/30]
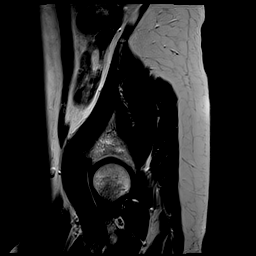

[Series 4: t2_tse axial · axial · 7.0mm · 0.98mm/px · z∈[-93,+133]mm · 5 of 26 slices shown]
[im 1/26]
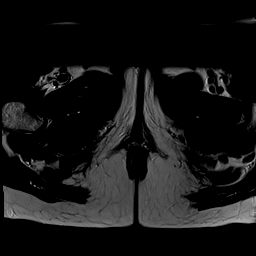
[im 7/26]
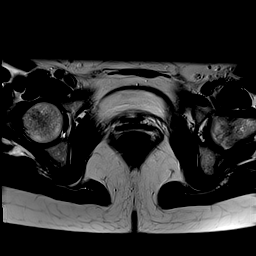
[im 13/26]
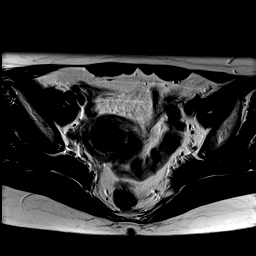
[im 19/26]
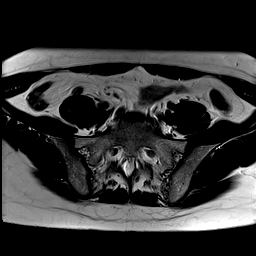
[im 26/26]
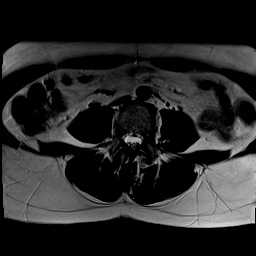

[Series 5: t2_tse axial fs · axial · 7.0mm · 0.98mm/px · z∈[-93,+133]mm · 5 of 26 slices shown]
[im 1/26]
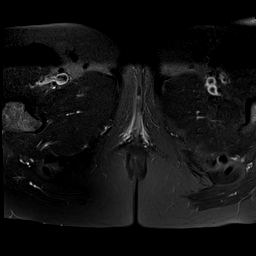
[im 7/26]
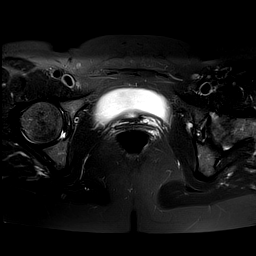
[im 13/26]
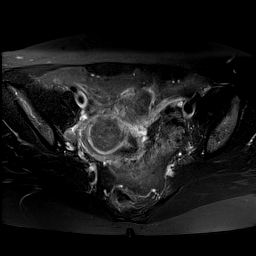
[im 19/26]
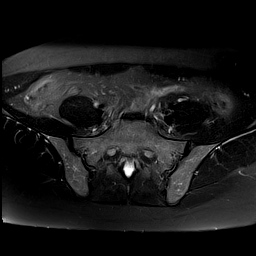
[im 26/26]
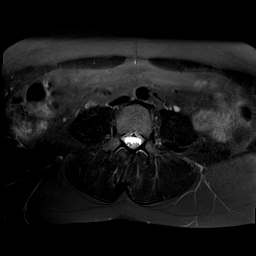

[Series 6: axial spgr · axial · 7.0mm · 0.98mm/px · z∈[-93,+133]mm · 5 of 26 slices shown]
[im 1/26]
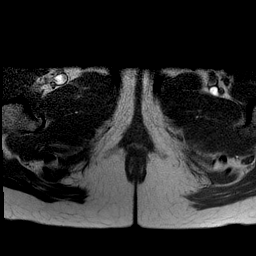
[im 7/26]
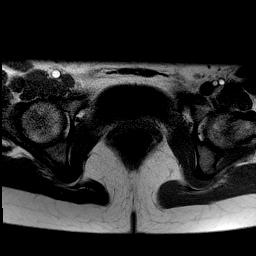
[im 13/26]
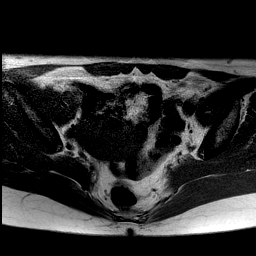
[im 19/26]
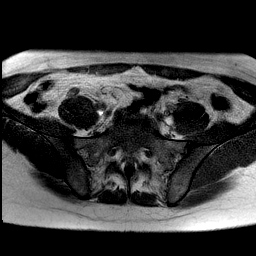
[im 26/26]
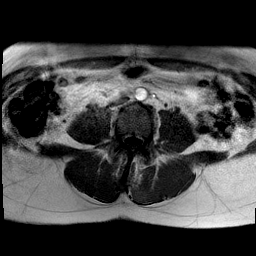

[Series 7: axial spgr pre · axial · non-contrast · 7.0mm · 0.49mm/px · z∈[-93,+133]mm · 5 of 26 slices shown]
[im 1/26]
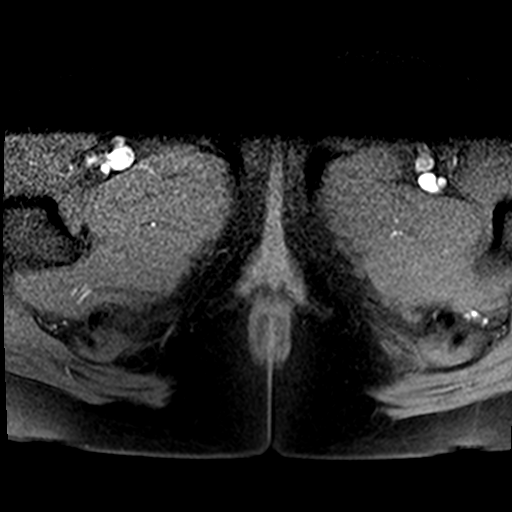
[im 7/26]
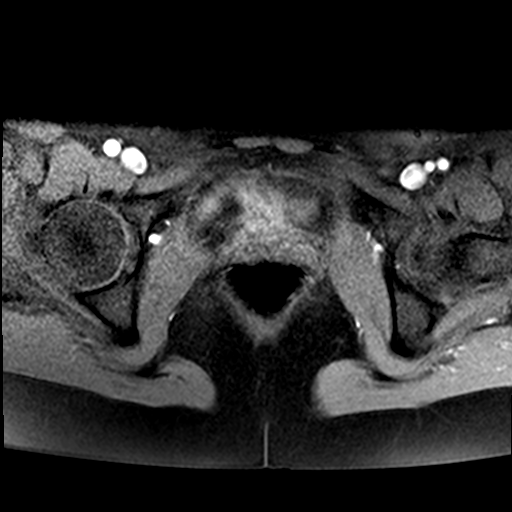
[im 13/26]
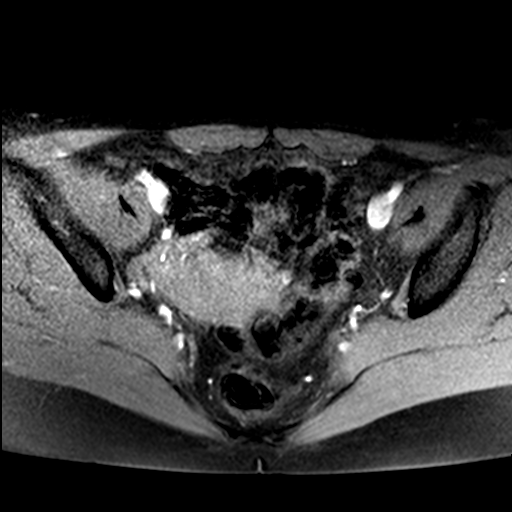
[im 19/26]
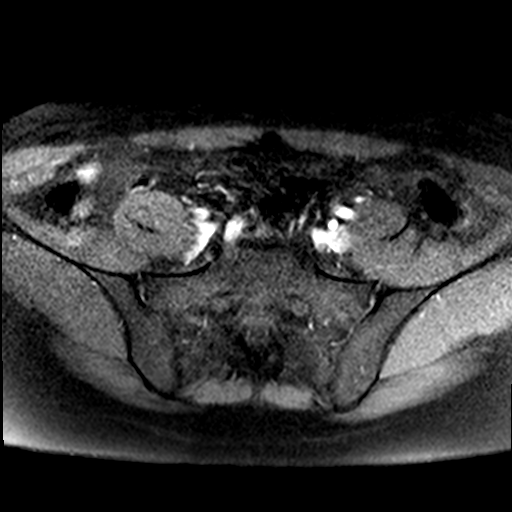
[im 26/26]
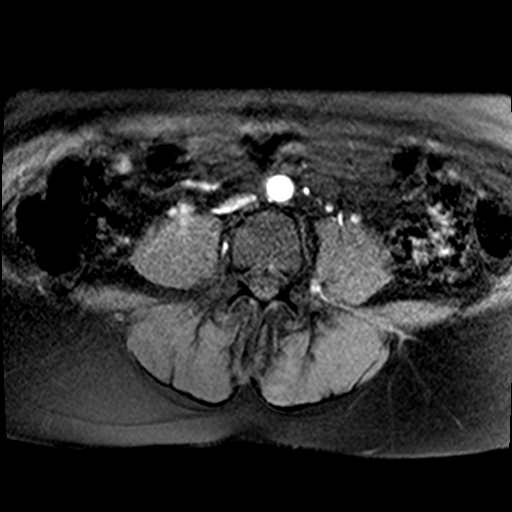

[Series 8: axial spgr post · axial · 7.0mm · 0.49mm/px · z∈[-93,+133]mm · 5 of 26 slices shown]
[im 1/26]
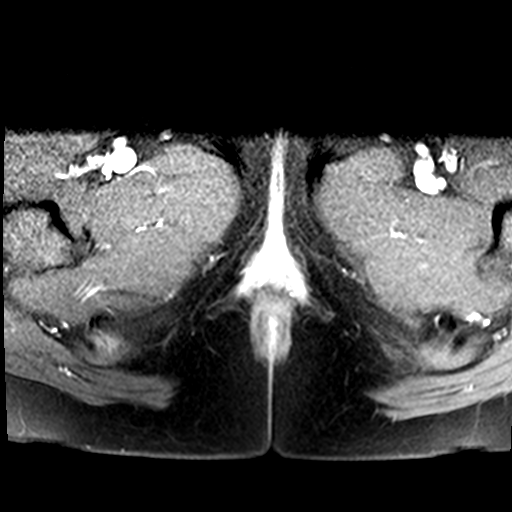
[im 7/26]
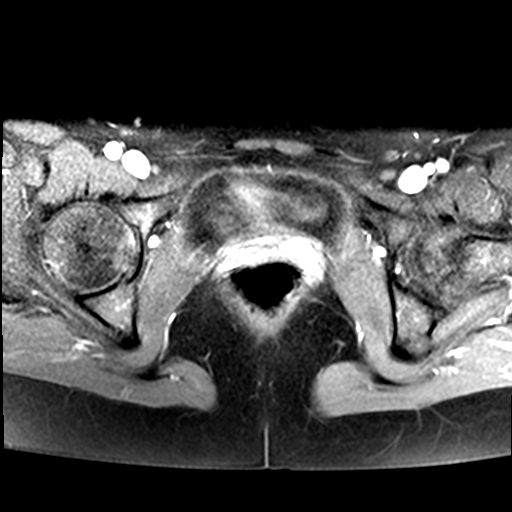
[im 13/26]
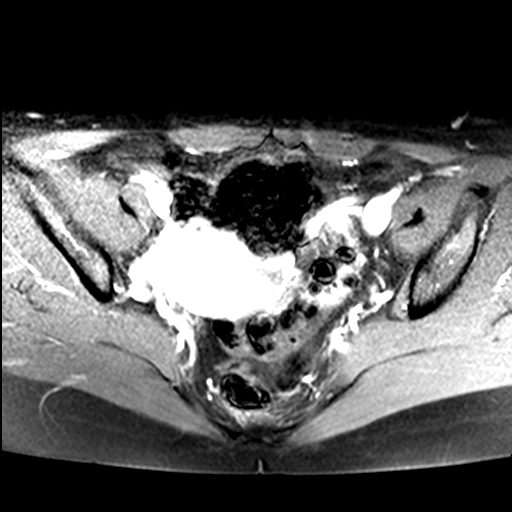
[im 19/26]
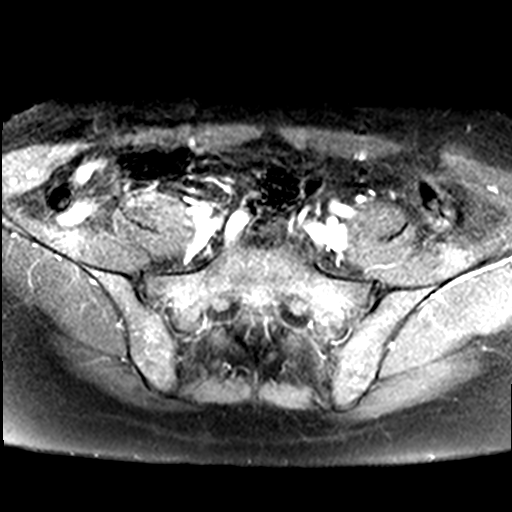
[im 26/26]
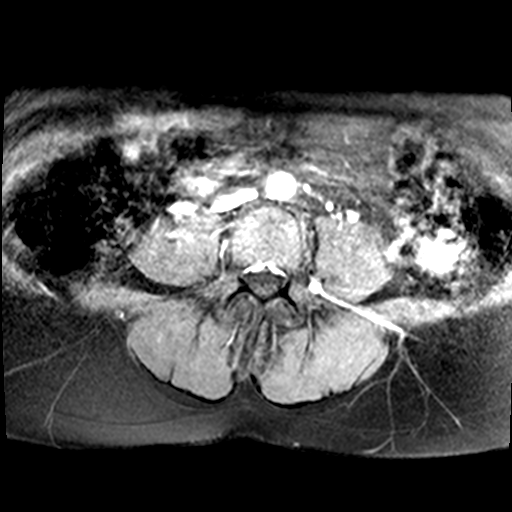

[33 of 48 positions shown; findings below may reference images not displayed]

FINDINGS: Lower Urinary Tract: No urinary bladder or urethral abnormality
identified.

Bowel: Unremarkable appearance of rectum and other pelvic bowel
loops.

Vascular/Lymphatic: Unremarkable. No pathologically enlarged pelvic
lymph nodes identified.

Reproductive:

-- Uterus: Measures 6.5 x 3.4 by 6.5 cm. A septate uterus is again
demonstrated. A new 3.5 cm fibroid is seen between the uterine horns
in the upper corpus and fundus. A probable 2nd tiny less than 1 cm
submucosal fibroid is also noted in the posterior uterine corpus.
Thin endometrium noted. The cervix and vagina are normal in
appearance.

-- Right ovary: Appears normal. No mass or inflammatory process
identified.

-- Left ovary: Appears normal. No mass or inflammatory process
identified.

Other: No peritoneal thickening or abnormal free fluid.

Musculoskeletal:  Unremarkable.
IMPRESSION: New 3.5 cm fibroid in the upper uterine corpus and fundus. Probable
second sub-cm submucosal fibroid in the posterior corpus.

Septate uterus, as better demonstrated on prior MRI in 7440.

Normal appearance of both ovaries.

## 2021-11-29 ENCOUNTER — Emergency Department (HOSPITAL_COMMUNITY)
Admission: EM | Admit: 2021-11-29 | Discharge: 2021-11-29 | Disposition: A | Discharge status: Home / Self Care | Payer: Managed Care, Other (non HMO) | Attending: Emergency Medicine | Admitting: Emergency Medicine

## 2021-11-29 ENCOUNTER — Encounter (HOSPITAL_COMMUNITY): Payer: Self-pay

## 2021-11-29 DIAGNOSIS — R7989 Other specified abnormal findings of blood chemistry: Secondary | ICD-10-CM | POA: Insufficient documentation

## 2021-11-29 DIAGNOSIS — N939 Abnormal uterine and vaginal bleeding, unspecified: Secondary | ICD-10-CM | POA: Diagnosis present

## 2021-11-29 LAB — COMPREHENSIVE METABOLIC PANEL
ALT: 12 U/L (ref 0–44)
AST: 15 U/L (ref 15–41)
Albumin: 3.7 g/dL (ref 3.5–5.0)
Alkaline Phosphatase: 41 U/L (ref 38–126)
Anion gap: 9 (ref 5–15)
BUN: 11 mg/dL (ref 6–20)
CO2: 22 mmol/L (ref 22–32)
Calcium: 8.9 mg/dL (ref 8.9–10.3)
Chloride: 108 mmol/L (ref 98–111)
Creatinine, Ser: 1.04 mg/dL — ABNORMAL HIGH (ref 0.44–1.00)
GFR, Estimated: >60 mL/min (ref >60)
Glucose, Bld: 84 mg/dL (ref 70–99)
Potassium: 3.5 mmol/L (ref 3.5–5.1)
Sodium: 139 mmol/L (ref 135–145)
Total Bilirubin: 0.3 mg/dL (ref 0.3–1.2)
Total Protein: 6.2 g/dL — ABNORMAL LOW (ref 6.5–8.1)

## 2021-11-29 LAB — CBC WITH DIFFERENTIAL/PLATELET
Abs Immature Granulocytes: 0.02 10*3/uL (ref 0.00–0.07)
Basophils Absolute: 0 10*3/uL (ref 0.0–0.1)
Basophils Relative: 0 %
Eosinophils Absolute: 0.1 10*3/uL (ref 0.0–0.5)
Eosinophils Relative: 1 %
HCT: 39.2 % (ref 36.0–46.0)
Hemoglobin: 13 g/dL (ref 12.0–15.0)
Immature Granulocytes: 0 %
Lymphocytes Relative: 34 %
Lymphs Abs: 2.4 10*3/uL (ref 0.7–4.0)
MCH: 30 pg (ref 26.0–34.0)
MCHC: 33.2 g/dL (ref 30.0–36.0)
MCV: 90.3 fL (ref 80.0–100.0)
Monocytes Absolute: 0.5 10*3/uL (ref 0.1–1.0)
Monocytes Relative: 6 %
Neutro Abs: 4 10*3/uL (ref 1.7–7.7)
Neutrophils Relative %: 59 %
Platelets: 295 10*3/uL (ref 150–400)
RBC: 4.34 MIL/uL (ref 3.87–5.11)
RDW: 12.6 % (ref 11.5–15.5)
WBC: 7 10*3/uL (ref 4.0–10.5)
nRBC: 0 % (ref 0.0–0.2)

## 2021-11-29 LAB — LIPASE, BLOOD: Lipase: 30 U/L (ref 11–51)

## 2021-11-29 LAB — I-STAT BETA HCG BLOOD, ED (MC, WL, AP ONLY): I-stat hCG, quantitative: <5 m[IU]/mL (ref <5)

## 2021-11-29 MED ORDER — MEGESTROL ACETATE 40 MG PO TABS: 40.0000 mg | ORAL_TABLET | Freq: Two times a day (BID) | ORAL | 0 refills | Status: AC

## 2021-11-29 MED ORDER — MEGESTROL ACETATE 40 MG PO TABS: 40.0000 mg | ORAL_TABLET | Freq: Every day | ORAL | Status: DC

## 2021-11-29 MED ORDER — ONDANSETRON HCL 4 MG PO TABS: 4.0000 mg | ORAL_TABLET | Freq: Four times a day (QID) | ORAL | 0 refills | Status: DC

## 2021-11-29 MED ORDER — IBUPROFEN 800 MG PO TABS: 800.0000 mg | ORAL_TABLET | Freq: Three times a day (TID) | ORAL | 0 refills | Status: DC

## 2021-11-29 NOTE — ED Provider Notes (Signed)
I received this patient in handoff from previous provider.  Please see their note for original history and work-up.  In short patient is a 51 year old female who presented with heavy vaginal bleeding.  Has a history of fibroids.  Says that she is passing large clots.  She is hemodynamically stable with a normal hemoglobin.  Awaiting OB recommendations.  Will dispo accordingly.  Physical Exam  BP 110/74   Pulse 69   Temp 98.9 F (37.2 C) (Oral)   Resp 16   Ht '5\' 6"'$  (1.676 m)   Wt 72 kg   SpO2 99%   BMI 25.62 kg/m   Physical Exam Vitals and nursing note reviewed.  Constitutional:      Appearance: Normal appearance.  HENT:     Head: Normocephalic and atraumatic.  Eyes:     General: No scleral icterus.    Conjunctiva/sclera: Conjunctivae normal.  Pulmonary:     Effort: Pulmonary effort is normal. No respiratory distress.  Skin:    Findings: No rash.  Neurological:     Mental Status: She is alert.  Psychiatric:        Mood and Affect: Mood normal.     Procedures  Procedures  ED Course / MDM   Clinical Course as of 11/29/21 1615  Wed Nov 29, 2021  1603 OB/GYN repaged [MR]    Clinical Course User Index [MR] Azure Budnick, Cecilio Asper, PA-C   Medical Decision Making Amount and/or Complexity of Data Reviewed Labs: ordered.  Risk Prescription drug management.   5:05pm: I spoke with Dr. Benjie Karvonen who recommended stopping current birth control and starting twice daily Megace.  40 mg.  She is stable to follow-up at her planned appointment time however patient reports she would like to call in the morning to try and get a sooner appointment.  I told her that this is reasonable however if she is unable to get in she can continue to take the medications as prescribed.  Also given ibuprofen and instructed to use Tylenol in conjunction with this for pain control.  Return precautions discussed and patient was discharged      Rhae Hammock, PA-C 11/29/21 1717    Cristie Hem, MD 11/30/21 1328

## 2021-11-29 NOTE — ED Triage Notes (Signed)
Pt coming from work via [employer] for vaginal bleeding and abdominal pain on lower left side. Pt states she has been having light vaginal bleeding for the last week, passed a few large clots on Monday. While at work bleeding increased significantly and more large clots. Pt states she started a new birth control about 5 months ago called April.   BP 110 palp HR 86

## 2021-11-29 NOTE — Discharge Instructions (Signed)
The Megace is a twice daily medication.  You can also take the ibuprofen as needed for pain and cramps.  Tylenol is safe to take with these medications as well.  Follow-up with your OB/GYN first thing in the morning and if they cannot get you in sooner you may continue to take the medications until your planned appointment.  Return with any worsening bleeding, especially if you become lightheaded, short of breath, dizzy or feel as though you are going to pass out.  Your work note is attached.  It was a pleasure to meet you and we hope you feel better!

## 2021-11-29 NOTE — ED Provider Notes (Signed)
[facility] DEPART[facility] Provider Note   CSN: [id number] Arrival date & time: 11/29/21  1252     History  Chief Complaint  Patient presents with   Vaginal Bleeding    Connie Wheeler is a 51 y.o. female.   Vaginal Bleeding Associated symptoms: abdominal pain    Patient is a 51 year old female with past medical history of leiomyoma presented to the emergency department for the evaluation of vaginal bleeding.  Patient states she started to have vaginal bleeding on Monday this week and change 1 pad.  On Tuesday there was some mild bleeding and today she soaked 2 pads with blood clots and blood leaking out of her garments. States she has had abdominal pain and cramping since this morning. Patient states she has a septate uterus, 2 vaginal deliveries, no abortions.  Denies chest pain, shortness of breath, headache, dizziness, bowel changes, urinary symptoms.  States she has been recently switched to [facility] for contraception and has been on it for 4 months.  Home Medications Prior to Admission medications   Medication Sig Start Date End Date Taking? Authorizing Provider  hydrOXYzine (ATARAX/VISTARIL) 25 MG tablet Take 25 mg by mouth at bedtime.    [provider]  levonorgestrel-ethinyl estradiol (ENPRESSE,TRIVORA) tablet Take 1 tablet by mouth daily.    [provider]  pentosan polysulfate (ELMIRON) 100 MG capsule Take 100 mg by mouth 3 (three) times daily.    [provider]  predniSONE (DELTASONE) 50 MG tablet Take 1 tablet (50 mg total) by mouth daily. 12/12/15   Mabe, Forbes Cellar, MD      Allergies    Patient has no known allergies.    Review of Systems   Review of Systems  Gastrointestinal:  Positive for abdominal pain.  Genitourinary:  Positive for vaginal bleeding.     Physical Exam Updated Vital Signs BP 127/78   Pulse 78   Temp 99.2 F (37.3 C)   Resp 18   Ht '5\' 6"'$  (1.676 m)   Wt 72 kg   SpO2 98%   BMI 25.62  kg/m  Physical Exam Vitals and nursing note reviewed.  Constitutional:      Appearance: Normal appearance.  HENT:     Head: Normocephalic and atraumatic.     Mouth/Throat:     Mouth: Mucous membranes are moist.  Eyes:     General: No scleral icterus. Cardiovascular:     Rate and Rhythm: Normal rate and regular rhythm.     Pulses: Normal pulses.     Heart sounds: Normal heart sounds.  Pulmonary:     Effort: Pulmonary effort is normal.     Breath sounds: Normal breath sounds.  Abdominal:     General: Abdomen is flat.     Palpations: Abdomen is soft.     Tenderness: There is abdominal tenderness ((all 4 quadrants)).  Genitourinary:    Comments: Pelvic exam shows moderate amount of blood clots in the vaginal canal. Vaginal wall normal. Cervix normal. Musculoskeletal:        General: No deformity.  Skin:    General: Skin is warm.     Findings: No rash.  Neurological:     General: No focal deficit present.     Mental Status: She is alert.  Psychiatric:        Mood and Affect: Mood normal.     ED Results / Procedures / Treatments   Labs (all labs ordered are listed, but only abnormal results are displayed) Labs  Reviewed  COMPREHENSIVE METABOLIC PANEL - Abnormal; Notable for the following components:      Result Value   Creatinine, Ser 1.04 (*)    Total Protein 6.2 (*)    All other components within normal limits  CBC WITH DIFFERENTIAL/PLATELET  LIPASE, BLOOD  I-STAT BETA HCG BLOOD, ED ([facility], [facility], [facility] ONLY)    EKG None  Radiology No results found.  Procedures Pelvic exam  Date/Time: 11/29/2021 2:38 PM  Performed by: Rex Kras, PA Authorized by: Deno Etienne, DO  Consent: Verbal consent obtained. Risks and benefits: risks, benefits and alternatives were discussed Consent given by: patient Patient understanding: patient states understanding of the procedure being performed Patient consent: the patient's understanding of the procedure matches consent given Patient  identity confirmed: verbally with patient Preparation: Patient was prepped and draped in the usual sterile fashion. Local anesthesia used: no  Anesthesia: Local anesthesia used: no  Sedation: Patient sedated: no        Medications Ordered in ED Medications - No data to display  ED Course/ Medical Decision Making/ A&P Clinical Course as of 11/29/21 2048  Wed Nov 29, 2021  1603 OB/GYN repaged [MR]    Clinical Course User Index [MR] Redwine, Cecilio Asper, PA-C                           Medical Decision Making Amount and/or Complexity of Data Reviewed Labs: ordered.  Risk Prescription drug management.   This patient presents to the ED for concern of vaginal bleeding, this involves an extensive number of treatment options, and is a complaint that carries with it a high risk of complications and morbidity.  The differential diagnosis includes leiomyoma, ectopic pregnancy, PID, polyp, adenomyosis, endometrial bleeding, malignancy.   Co morbidities that complicate the patient evaluation  See HPI   Additional history obtained:  Additional history obtained from EMR External records from outside source obtained and reviewed including Care Everywhere/External Records and Primary Care Documents  Lab Tests:  I Ordered, and personally interpreted labs.  The pertinent results include:   No leukocytosis noted.  No evidence of anemia.  Platelets within normal range.  No electrolyte abnormalities noted.  Renal function with slightly elevated creatinine 1/04.  No transaminitis noted.  Lipase within normal limits.  UA significant for no acute abnormalities.   Imaging Studies ordered:  NA   Cardiac Monitoring: / EKG:  The patient was maintained on a cardiac monitor.  I personally viewed and interpreted the cardiac monitored which showed an underlying rhythm of: sinus rhythm   Consultations Obtained:  I requested consultation with OB-GYN,  and discussed lab and imaging findings  as well as pertinent plan. Call will be received by the next provider taking over care in the ED.   Problem List / ED Course / Critical interventions / Medication management  Vaginal bleeding Vitals signs significant for BP 139/93. Otherwise within normal range and stable throughout visit. Laboratory/imaging studies significant for: See above Patient is a 51 year old female with past medical history of leiomyoma presented to the emergency department for the evaluation of vaginal bleeding.  On physical examination, patient is afebrile, vital signs stable, appears in no acute distress.  She has tenderness to palpation to all 4 quadrants, most on the left lower quadrant.  Pelvic exam show blood clots in the vaginal canal, vaginal wall normal, cervix normal.  MRI in July 2020 showed fibroid in the upper uterus. We will obtain OB-GYN consult. Patient is  likely to be discharged with OBGYN follow up. I have reviewed the patients home medicines and have made adjustments as needed    Social Determinants of Health:  NA   Test / Admission - Considered:  Signed out patient care to Mervyn Gay, PA-C         Final Clinical Impression(s) / ED Diagnoses Final diagnoses:  None    Rx / DC Orders ED Discharge Orders     None         Rex Kras, [place] 11/29/21 2050    Deno Etienne, DO 11/30/21 [id number]

## 2021-12-11 ENCOUNTER — Encounter (HOSPITAL_BASED_OUTPATIENT_CLINIC_OR_DEPARTMENT_OTHER): Payer: Self-pay | Admitting: Obstetrics & Gynecology

## 2021-12-11 ENCOUNTER — Other Ambulatory Visit: Payer: Self-pay | Admitting: Obstetrics & Gynecology

## 2021-12-13 ENCOUNTER — Encounter (HOSPITAL_BASED_OUTPATIENT_CLINIC_OR_DEPARTMENT_OTHER): Payer: Self-pay | Admitting: Obstetrics & Gynecology

## 2021-12-14 ENCOUNTER — Encounter (HOSPITAL_BASED_OUTPATIENT_CLINIC_OR_DEPARTMENT_OTHER): Payer: Self-pay | Admitting: Obstetrics & Gynecology

## 2021-12-14 NOTE — Progress Notes (Signed)
Spoke w/ via phone for pre-op interview--- pt Lab needs dos----  cbc, bmp, t&s, urine preg           Lab results------ no COVID test -----patient states asymptomatic no test needed Arrive at ------- 0815 on 12-20-2021 NPO after MN NO Solid Food.  Clear liquids from MN until--- 0715 Med rec completed Medications to take morning of surgery ----- wellbutrin, megace Diabetic medication ----- n/a Patient instructed no nail polish to be worn day of surgery Patient instructed to bring photo id and insurance card day of surgery Patient aware to have Driver (ride ) / caregiver for 24 hours after surgery -- husband, Connie Wheeler Patient Special Instructions ----- n/a Pre-Op special Istructions ----- n/a Patient verbalized understanding of instructions that were given at this phone interview. Patient denies shortness of breath, chest pain, fever, cough at this phone interview.

## 2021-12-20 ENCOUNTER — Encounter (HOSPITAL_BASED_OUTPATIENT_CLINIC_OR_DEPARTMENT_OTHER)
Admission: RE | Disposition: A | Discharge status: Home / Self Care | Payer: Self-pay | Source: Home / Self Care | Attending: Obstetrics & Gynecology

## 2021-12-20 ENCOUNTER — Encounter (HOSPITAL_BASED_OUTPATIENT_CLINIC_OR_DEPARTMENT_OTHER): Payer: Self-pay | Admitting: Obstetrics & Gynecology

## 2021-12-20 ENCOUNTER — Ambulatory Visit (HOSPITAL_BASED_OUTPATIENT_CLINIC_OR_DEPARTMENT_OTHER): Payer: Managed Care, Other (non HMO) | Admitting: Anesthesiology

## 2021-12-20 ENCOUNTER — Other Ambulatory Visit: Payer: Self-pay

## 2021-12-20 ENCOUNTER — Ambulatory Visit (HOSPITAL_BASED_OUTPATIENT_CLINIC_OR_DEPARTMENT_OTHER)
Admission: RE | Admit: 2021-12-20 | Discharge: 2021-12-20 | Disposition: A | Discharge status: Home / Self Care | Payer: Managed Care, Other (non HMO) | Attending: Obstetrics & Gynecology | Admitting: Obstetrics & Gynecology

## 2021-12-20 DIAGNOSIS — Q5128 Other doubling of uterus, other specified: Secondary | ICD-10-CM

## 2021-12-20 DIAGNOSIS — D649 Anemia, unspecified: Secondary | ICD-10-CM | POA: Insufficient documentation

## 2021-12-20 DIAGNOSIS — J45909 Unspecified asthma, uncomplicated: Secondary | ICD-10-CM | POA: Diagnosis not present

## 2021-12-20 DIAGNOSIS — N921 Excessive and frequent menstruation with irregular cycle: Secondary | ICD-10-CM

## 2021-12-20 DIAGNOSIS — F418 Other specified anxiety disorders: Secondary | ICD-10-CM | POA: Insufficient documentation

## 2021-12-20 DIAGNOSIS — N920 Excessive and frequent menstruation with regular cycle: Secondary | ICD-10-CM | POA: Diagnosis present

## 2021-12-20 DIAGNOSIS — D759 Disease of blood and blood-forming organs, unspecified: Secondary | ICD-10-CM | POA: Diagnosis not present

## 2021-12-20 DIAGNOSIS — R519 Headache, unspecified: Secondary | ICD-10-CM | POA: Diagnosis not present

## 2021-12-20 DIAGNOSIS — Z01818 Encounter for other preprocedural examination: Secondary | ICD-10-CM

## 2021-12-20 HISTORY — DX: Interstitial cystitis (chronic) without hematuria: N30.10

## 2021-12-20 HISTORY — PX: DILITATION & CURRETTAGE/HYSTROSCOPY WITH HYDROTHERMAL ABLATION: SHX5570

## 2021-12-20 HISTORY — DX: Unspecified symptoms and signs involving the genitourinary system: R39.9

## 2021-12-20 HISTORY — DX: Migraine, unspecified, not intractable, without status migrainosus: G43.909

## 2021-12-20 HISTORY — DX: Exercise induced bronchospasm: J45.990

## 2021-12-20 HISTORY — DX: Family history of other specified conditions: Z84.89

## 2021-12-20 HISTORY — DX: Other and unspecified doubling of uterus: Q51.28

## 2021-12-20 HISTORY — DX: Generalized anxiety disorder: F41.1

## 2021-12-20 HISTORY — DX: Intramural leiomyoma of uterus: D25.1

## 2021-12-20 HISTORY — DX: Major depressive disorder, single episode, unspecified: F32.9

## 2021-12-20 HISTORY — DX: Iron deficiency anemia, unspecified: D50.9

## 2021-12-20 LAB — POCT PREGNANCY, URINE: Preg Test, Ur: NEGATIVE

## 2021-12-20 LAB — CBC
HCT: 41.8 % (ref 36.0–46.0)
Hemoglobin: 13.6 g/dL (ref 12.0–15.0)
MCH: 29.2 pg (ref 26.0–34.0)
MCHC: 32.5 g/dL (ref 30.0–36.0)
MCV: 89.9 fL (ref 80.0–100.0)
Platelets: 291 10*3/uL (ref 150–400)
RBC: 4.65 MIL/uL (ref 3.87–5.11)
RDW: 12.6 % (ref 11.5–15.5)
WBC: 6.6 10*3/uL (ref 4.0–10.5)
nRBC: 0 % (ref 0.0–0.2)

## 2021-12-20 LAB — TYPE AND SCREEN
ABO/RH(D): A POS
Antibody Screen: NEGATIVE

## 2021-12-20 LAB — BASIC METABOLIC PANEL
Anion gap: 8 (ref 5–15)
BUN: 15 mg/dL (ref 6–20)
CO2: 22 mmol/L (ref 22–32)
Calcium: 8.9 mg/dL (ref 8.9–10.3)
Chloride: 110 mmol/L (ref 98–111)
Creatinine, Ser: 1.01 mg/dL — ABNORMAL HIGH (ref 0.44–1.00)
GFR, Estimated: >60 mL/min (ref >60)
Glucose, Bld: 94 mg/dL (ref 70–99)
Potassium: 4 mmol/L (ref 3.5–5.1)
Sodium: 140 mmol/L (ref 135–145)

## 2021-12-20 LAB — ABO/RH: ABO/RH(D): A POS

## 2021-12-20 SURGERY — DILATATION & CURETTAGE/HYSTEROSCOPY WITH HYDROTHERMAL ABLATION
Anesthesia: General | Site: Uterus

## 2021-12-20 MED ORDER — MIDAZOLAM HCL 2 MG/2ML IJ SOLN
INTRAMUSCULAR | Status: AC
  Filled 2021-12-20: qty 2

## 2021-12-20 MED ORDER — LIDOCAINE HCL (PF) 2 % IJ SOLN
INTRAMUSCULAR | Status: AC
  Filled 2021-12-20: qty 5

## 2021-12-20 MED ORDER — KETOROLAC TROMETHAMINE 30 MG/ML IJ SOLN
INTRAMUSCULAR | Status: AC
  Filled 2021-12-20: qty 1

## 2021-12-20 MED ORDER — LIDOCAINE 2% (20 MG/ML) 5 ML SYRINGE
INTRAMUSCULAR | Status: DC | PRN
  Administered 2021-12-20: 60 mg via INTRAVENOUS

## 2021-12-20 MED ORDER — SODIUM CHLORIDE 0.9 % IR SOLN
Status: DC | PRN
  Administered 2021-12-20: 3000 mL

## 2021-12-20 MED ORDER — ACETAMINOPHEN 500 MG PO TABS
ORAL_TABLET | ORAL | Status: AC
  Filled 2021-12-20: qty 2

## 2021-12-20 MED ORDER — FENTANYL CITRATE (PF) 100 MCG/2ML IJ SOLN
INTRAMUSCULAR | Status: AC
  Filled 2021-12-20: qty 2

## 2021-12-20 MED ORDER — PROPOFOL 10 MG/ML IV BOLUS
INTRAVENOUS | Status: AC
  Filled 2021-12-20: qty 20

## 2021-12-20 MED ORDER — FENTANYL CITRATE (PF) 100 MCG/2ML IJ SOLN
INTRAMUSCULAR | Status: DC | PRN
  Administered 2021-12-20: 25 ug via INTRAVENOUS
  Administered 2021-12-20: 75 ug via INTRAVENOUS

## 2021-12-20 MED ORDER — MIDAZOLAM HCL 2 MG/2ML IJ SOLN
INTRAMUSCULAR | Status: DC | PRN
  Administered 2021-12-20: 2 mg via INTRAVENOUS

## 2021-12-20 MED ORDER — LACTATED RINGERS IV SOLN: INTRAVENOUS | Status: DC

## 2021-12-20 MED ORDER — DEXAMETHASONE SODIUM PHOSPHATE 10 MG/ML IJ SOLN
INTRAMUSCULAR | Status: AC
  Filled 2021-12-20: qty 1

## 2021-12-20 MED ORDER — OXYCODONE-ACETAMINOPHEN 5-325 MG PO TABS: 1.0000 | ORAL_TABLET | Freq: Four times a day (QID) | ORAL | 0 refills | Status: DC | PRN

## 2021-12-20 MED ORDER — FENTANYL CITRATE (PF) 100 MCG/2ML IJ SOLN
25.0000 ug | INTRAMUSCULAR | Status: DC | PRN
  Administered 2021-12-20 (×2): 50 ug via INTRAVENOUS

## 2021-12-20 MED ORDER — KETOROLAC TROMETHAMINE 30 MG/ML IJ SOLN
INTRAMUSCULAR | Status: DC | PRN
  Administered 2021-12-20: 30 mg via INTRAVENOUS

## 2021-12-20 MED ORDER — ONDANSETRON HCL 4 MG/2ML IJ SOLN
INTRAMUSCULAR | Status: AC
  Filled 2021-12-20: qty 2

## 2021-12-20 MED ORDER — CHLOROPROCAINE HCL 1 % IJ SOLN
INTRAMUSCULAR | Status: DC | PRN
  Administered 2021-12-20: 20 mL

## 2021-12-20 MED ORDER — POVIDONE-IODINE 10 % EX SWAB: 2.0000 | Freq: Once | CUTANEOUS | Status: DC

## 2021-12-20 MED ORDER — ONDANSETRON HCL 4 MG/2ML IJ SOLN
INTRAMUSCULAR | Status: DC | PRN
  Administered 2021-12-20: 4 mg via INTRAVENOUS

## 2021-12-20 MED ORDER — CEFAZOLIN SODIUM-DEXTROSE 2-4 GM/100ML-% IV SOLN: 2.0000 g | INTRAVENOUS | Status: DC

## 2021-12-20 MED ORDER — DEXAMETHASONE SODIUM PHOSPHATE 10 MG/ML IJ SOLN
INTRAMUSCULAR | Status: DC | PRN
  Administered 2021-12-20: 4 mg via INTRAVENOUS

## 2021-12-20 MED ORDER — ACETAMINOPHEN 500 MG PO TABS
1000.0000 mg | ORAL_TABLET | Freq: Once | ORAL | Status: AC
  Administered 2021-12-20: 1000 mg via ORAL

## 2021-12-20 MED ORDER — PROPOFOL 10 MG/ML IV BOLUS
INTRAVENOUS | Status: DC | PRN
  Administered 2021-12-20: 150 mg via INTRAVENOUS

## 2021-12-20 SURGICAL SUPPLY — 17 items
GAUZE 4X4 16PLY ~~LOC~~+RFID DBL (SPONGE) IMPLANT
GLOVE BIO SURGEON STRL SZ 6 (GLOVE) IMPLANT
GLOVE BIO SURGEON STRL SZ7 (GLOVE) ×2 IMPLANT
GLOVE BIOGEL PI IND STRL 6 (GLOVE) IMPLANT
GLOVE BIOGEL PI IND STRL 6.5 (GLOVE) IMPLANT
GLOVE BIOGEL PI IND STRL 7.0 (GLOVE) ×4 IMPLANT
GOWN STRL REUS W/TWL LRG LVL3 (GOWN DISPOSABLE) ×4 IMPLANT
IV NS IRRIG 3000ML ARTHROMATIC (IV SOLUTION) ×2 IMPLANT
KIT TURNOVER CYSTO (KITS) ×2 IMPLANT
PACK VAGINAL MINOR WOMEN LF (CUSTOM PROCEDURE TRAY) ×2 IMPLANT
PAD OB MATERNITY 4.3X12.25 (PERSONAL CARE ITEMS) ×2 IMPLANT
PAD PREP 24X48 CUFFED NSTRL (MISCELLANEOUS) ×2 IMPLANT
SET GENESYS HTA PROCERVA (MISCELLANEOUS) IMPLANT
SOL PREP POV-IOD 4OZ 10% (MISCELLANEOUS) IMPLANT
SUT VIC AB 0 CT1 27 (SUTURE) ×2
SUT VIC AB 0 CT1 27XBRD ANTBC (SUTURE) IMPLANT
TOWEL OR 17X26 10 PK STRL BLUE (TOWEL DISPOSABLE) ×2 IMPLANT

## 2021-12-20 NOTE — Anesthesia Procedure Notes (Signed)
Procedure Name: LMA Insertion Date/Time: 12/20/2021 10:27 AM  Performed by: Suan Halter, CRNAPre-anesthesia Checklist: Patient identified, Emergency Drugs available, Suction available and Patient being monitored Patient Re-evaluated:Patient Re-evaluated prior to induction Oxygen Delivery Method: Circle system utilized Preoxygenation: Pre-oxygenation with 100% oxygen Induction Type: IV induction Ventilation: Mask ventilation without difficulty LMA: LMA inserted LMA Size: 4.0 Number of attempts: 1 Airway Equipment and Method: Bite block Placement Confirmation: positive ETCO2 Tube secured with: Tape Dental Injury: Teeth and Oropharynx as per pre-operative assessment

## 2021-12-20 NOTE — Anesthesia Postprocedure Evaluation (Signed)
Anesthesia Post Note  Patient: JAELYNN POZO  Procedure(s) Performed: HYSTEROSCOPY WITH HYDROTHERMAL ABLATION (Uterus)     Patient location during evaluation: PACU Anesthesia Type: General Level of consciousness: awake and alert Pain management: pain level controlled Vital Signs Assessment: post-procedure vital signs reviewed and stable Respiratory status: spontaneous breathing, nonlabored ventilation and respiratory function stable Cardiovascular status: blood pressure returned to baseline and stable Postop Assessment: no apparent nausea or vomiting Anesthetic complications: no   No notable events documented.  Last Vitals:  Vitals:   12/20/21 1209 12/20/21 1215  BP:  118/73  Pulse: 76 80  Resp: 14 13  Temp:    SpO2: 97% 95%    Last Pain:  Vitals:   12/20/21 1226  TempSrc:   PainSc: 4                  Albertina Leise,W. EDMOND

## 2021-12-20 NOTE — Transfer of Care (Signed)
Immediate Anesthesia Transfer of Care Note  Patient: Connie Wheeler  Procedure(s) Performed: Procedure(s) (LRB): HYSTEROSCOPY WITH HYDROTHERMAL ABLATION (N/A)  Patient Location: PACU  Anesthesia Type: General  Level of Consciousness: awake, oriented, sedated and patient cooperative  Airway & Oxygen Therapy: Patient Spontanous Breathing and Patient connected to face mask oxygen  Post-op Assessment: Report given to PACU RN and Post -op Vital signs reviewed and stable  Post vital signs: Reviewed and stable  Complications: No apparent anesthesia complications  Last Vitals:  Vitals Value Taken Time  BP 132/58 12/20/21 1134  Temp    Pulse 80 12/20/21 1135  Resp    SpO2 99 % 12/20/21 1135  Vitals shown include unvalidated device data.  Last Pain:  Vitals:   12/20/21 0853  TempSrc: Oral  PainSc: 0-No pain      Patients Stated Pain Goal: 5 (03/88/82 8003)  Complications: No notable events documented.

## 2021-12-20 NOTE — H&P (Signed)
Connie Wheeler is an 51 y.o. female. 51 yo with menorrhagia, bleeding not stopping despite OCs and got very heavy, went to ER last month. She was started on Megace '40mg'$  bid after they spoke w/ me. C/o being tired and in pain from cramps and passing large clots. She is ready for the next step. She was counseled on endometrial ablation option if EBX was normal.   G2P2. Nl Pap hx. No breast problems. Nl mammogram  [facility] -Aug/7/23 -Uterus 6.3 x 6.7 x 4.1 cm, a 4.6 x 4.3 x 4.6 cm myoma noted, the right lateral, EMS 3.5 mm, both ovaries normal. Known septate uter    Past Medical History:  Diagnosis Date   Chronic interstitial cystitis    urologist--- dr Connie Wheeler. Connie Wheeler    (12-14-2021  per pt in remission on elmiron)   Exercise-induced asthma    Family history of adverse reaction to anesthesia    mother---  very hard to wake   GAD (generalized anxiety disorder)    IDA (iron deficiency anemia)    Intramural uterine fibroid    Lower urinary tract symptoms (LUTS)    MDD (major depressive disorder)    Menorrhagia    Migraines    Septate uterus     Past Surgical History:  Procedure Laterality Date   KNEE ARTHROSCOPY Right 2006   MASS EXCISION Left 2001   thigh  (benign)    History reviewed. No pertinent family history.  Social History:  reports that she has never smoked. She has never used smokeless tobacco. She reports that she does not drink alcohol and does not use drugs.  Allergies:  Allergies  Allergen Reactions   Tape     Per pt  "any adhesive tape of any type with strong adhesive it rips skin off and cause a burn"    Medications Prior to Admission  Medication Sig Dispense Refill Last Dose   ALBUTEROL IN Inhale into the lungs as needed.      aspirin-acetaminophen-caffeine (EXCEDRIN MIGRAINE) 250-250-65 MG tablet Take by mouth every 6 (six) hours as needed for headache.      buPROPion (WELLBUTRIN XL) 300 MG 24 hr tablet Take 300 mg by mouth every morning.      ferrous sulfate 324 MG  TBEC Take 324 mg by mouth at bedtime.      hydrOXYzine (VISTARIL) 25 MG capsule Take 25 mg by mouth every evening.      ibuprofen (ADVIL) 800 MG tablet Take 1 tablet (800 mg total) by mouth 3 (three) times daily. 42 tablet 0    Multiple Vitamins-Minerals (MULTIVITAMIN WOMEN PO) Take 2 tablets by mouth at bedtime.      pentosan polysulfate (ELMIRON) 100 MG capsule Take 200 mg by mouth at bedtime.      traZODone (DESYREL) 100 MG tablet Take 100 mg by mouth at bedtime.      APRI 0.15-30 MG-MCG tablet Take 1 tablet by mouth daily. (Patient not taking: Reported on 12/14/2021)   Not Taking   ondansetron (ZOFRAN) 4 MG tablet Take 1 tablet (4 mg total) by mouth every 6 (six) hours. (Patient not taking: Reported on 12/14/2021) 12 tablet 0 Not Taking    Review of Systems  Height '5\' 6"'$  (1.676 m), weight 64.4 kg. Physical Exam Physical exam:  A&O x 3, no acute distress. Pleasant HEENT neg, no thyromegaly Lungs CTA bilat CV RRR, S1S2 normal Abdo soft, non tender, non acute Extr no edema/ tenderness Pelvic Cx normal. Uterus 10 wk, nl adnexa  Results for orders placed or performed during the hospital encounter of 12/20/21 (from the past 24 hour(s))  Pregnancy, urine POC     Status: None   Collection Time: 12/20/21  8:28 AM  Result Value Ref Range   Preg Test, Ur NEGATIVE NEGATIVE    No results found.  Assessment/Plan: Acute worsening menorrhagia, normal endometrial pathology on endometrial biopsy. Fibroid about the same size since 2 months, can continue Megace and plan intervention with either IUD, endometrial ablation, hysterectomy discussed, risks benefits of all discussed,  patient would prefer to start with endometrial ablation and if it fails then go for hysterectomy. Plan HTA endometrial ablation due to septate uterus, plan to perform at the [facility]. Risks/complications of surgery reviewed incl infection, bleeding, damage to internal organs including bladder, bowels,  ureters, blood vessels, other risks from anesthesia, VTE and delayed complications of any surgery, complications in future surgery reviewed.   Connie Wheeler 12/20/2021, 8:35 AM

## 2021-12-20 NOTE — Op Note (Signed)
Connie Wheeler 12/20/1970  Date 12/20/21 Preoperative diagnosis: Menometrorrhagia, failed medical therapy. Septate uterus  Postop diagnosis: as above.  Procedure: Diagnostic Hysteroscopy and Hydrothermal endometrial ablation (HTA)  Anesthesia General via LMA and 20 cc paracervical block w/ 1% Nesacaine  Surgeon: Azucena Fallen, MD  Assistant:NA IV fluids 900 cc LR  Estimated blood loss 20 cc Urine output: NA, voided pre\op   Complications none  Condition stable  Disposition PACU  Specimen: none since office EBX was normal    Procedure  Indication: Menometrorrhagia. Septate uterus, not a candidate for Novasure.  Patient was counseled on risks/ complications including infection, bleeding, damage to internal organs, she understood and agrees, gave informed written consent.  Patient was brought to the operating room with IV running. Time out was carried out. Antibiotic not indicated. She underwent general anesthesia via LMA without complications. She was given dorsolithotomy position. Parts were prepped and draped in standard fashion. Bimanual exam revealed uterus to be anteverted and normal size. Speculum was placed and cervix was grasped with single-tooth tenaculum. Cervical block with 20 cc 1% plain Nesacaine given. The uterus was sounded to 8 cm. Cervical os was noted to be dilated and allowed 18 F dilator to pass easily. HTA  Hysteroscope was introduced in the uterine cavity under vision, using saline for irrigation. Findings:  Septate uterus noted. Both horns with normal appearing thin endometrium, no polyps noted, some blood clots noted. Curettage was deferred as office endometrial biopsy was proliferative/ progestin effect.  HTA began but we had to stop due to leak noted in test cycle. I placed 3 stitches with 0-Vicryl on cervical body to tightly close it as it was quite patulous. HTA reinserted and ridged seal fit was better. Seal test was successful and HTA was successfully completed in 10  min heating cycle. After cooling diagnostic hysteroscopy noted pale endometrium in both sides of the septum. HTA hysteroscope removed. Cervical sutures cut. Hemostasis noted.   Fluid deficit wnl.   All counts are correct x2. No complications. Patient was made supine dorsal anesthesia and brought to the recovery room in stable condition.  Patient will be discharged home today. Follow up in 4 weeks in office. Warning signs of infection and excessive bleeding reviewed.  I performed this surgery  Azucena Fallen, MD.

## 2021-12-20 NOTE — Anesthesia Preprocedure Evaluation (Addendum)
Anesthesia Evaluation  Patient identified by MRN, date of birth, ID band Patient awake    Reviewed: Allergy & Precautions, H&P , NPO status , Patient's Chart, lab work & pertinent test results  History of Anesthesia Complications (+) Family history of anesthesia reaction  Airway Mallampati: II  TM Distance: >3 FB Neck ROM: Full    Dental no notable dental hx. (+) Teeth Intact, Dental Advisory Given   Pulmonary asthma ,    Pulmonary exam normal breath sounds clear to auscultation       Cardiovascular negative cardio ROS   Rhythm:Regular Rate:Normal     Neuro/Psych  Headaches, Anxiety Depression    GI/Hepatic negative GI ROS, Neg liver ROS,   Endo/Other  negative endocrine ROS  Renal/GU negative Renal ROS  negative genitourinary   Musculoskeletal   Abdominal   Peds  Hematology  (+) Blood dyscrasia, anemia ,   Anesthesia Other Findings   Reproductive/Obstetrics negative OB ROS                            Anesthesia Physical Anesthesia Plan  ASA: 2  Anesthesia Plan: General   Post-op Pain Management: Tylenol PO (pre-op)* and Toradol IV (intra-op)*   Induction: Intravenous  PONV Risk Score and Plan: 4 or greater and Ondansetron, Dexamethasone and Midazolam  Airway Management Planned: LMA  Additional Equipment:   Intra-op Plan:   Post-operative Plan: Extubation in OR  Informed Consent: I have reviewed the patients History and Physical, chart, labs and discussed the procedure including the risks, benefits and alternatives for the proposed anesthesia with the patient or authorized representative who has indicated his/her understanding and acceptance.     Dental advisory given  Plan Discussed with: CRNA  Anesthesia Plan Comments:         Anesthesia Quick Evaluation

## 2021-12-20 NOTE — Discharge Instructions (Signed)
DISCHARGE INSTRUCTIONS: HYSTEROSCOPY / ENDOMETRIAL ABLATION The following instructions have been prepared to help you care for yourself upon your return home.  May take tylenol again after 3pm if needed  May take Ibuprofen after 5:30pm if needed  May take stool softner while taking narcotic pain medication to prevent constipation.  Drink plenty of water.  Personal hygiene:  Use sanitary pads for vaginal drainage, not tampons.  Shower the day after your procedure.  NO tub baths, pools or Jacuzzis for 2-3 weeks.  Wipe front to back after using the bathroom.  Activity and limitations:  Do NOT drive or operate any equipment for 24 hours. The effects of anesthesia are still present and drowsiness may result.  Do NOT rest in bed all day.  Walking is encouraged.  Walk up and down stairs slowly.  You may resume your normal activity in one to two days or as indicated by your physician. Sexual activity: NO intercourse for at least 2 weeks after the procedure, or as indicated by your Doctor.  Diet: Eat a light meal as desired this evening. You may resume your usual diet tomorrow.  Return to Work: You may resume your work activities in one to two days or as indicated by Marine scientist.  What to expect after your surgery: Expect to have vaginal bleeding/discharge for 2-3 days and spotting for up to 10 days. It is not unusual to have soreness for up to 1-2 weeks. You may have a slight burning sensation when you urinate for the first day. Mild cramps may continue for a couple of days. You may have a regular period in 2-6 weeks.  Call your doctor for any of the following:  Excessive vaginal bleeding or clotting, saturating and changing one pad every hour.  Inability to urinate 6 hours after discharge from hospital.  Pain not relieved by pain medication.  Fever of 100.4 F or greater.  Unusual vaginal discharge or odor.    Post Anesthesia Home Care Instructions  Activity: Get plenty of  rest for the remainder of the day. A responsible individual must stay with you for 24 hours following the procedure.  For the next 24 hours, DO NOT: -Drive a car -Paediatric nurse -Drink alcoholic beverages -Take any medication unless instructed by your physician -Make any legal decisions or sign important papers.  Meals: Start with liquid foods such as gelatin or soup. Progress to regular foods as tolerated. Avoid greasy, spicy, heavy foods. If nausea and/or vomiting occur, drink only clear liquids until the nausea and/or vomiting subsides. Call your physician if vomiting continues.  Special Instructions/Symptoms: Your throat may feel dry or sore from the anesthesia or the breathing tube placed in your throat during surgery. If this causes discomfort, gargle with warm salt water. The discomfort should disappear within 24 hours.

## 2021-12-21 ENCOUNTER — Encounter (HOSPITAL_BASED_OUTPATIENT_CLINIC_OR_DEPARTMENT_OTHER): Payer: Self-pay | Admitting: Obstetrics & Gynecology

## 2022-04-18 ENCOUNTER — Encounter: Payer: Self-pay | Admitting: Obstetrics & Gynecology

## 2022-04-18 ENCOUNTER — Ambulatory Visit: Payer: Managed Care, Other (non HMO) | Admitting: Obstetrics & Gynecology

## 2022-04-18 VITALS — BP 114/72 | HR 90

## 2022-04-18 DIAGNOSIS — Z9889 Other specified postprocedural states: Secondary | ICD-10-CM

## 2022-04-18 DIAGNOSIS — D251 Intramural leiomyoma of uterus: Secondary | ICD-10-CM

## 2022-04-18 DIAGNOSIS — N920 Excessive and frequent menstruation with regular cycle: Secondary | ICD-10-CM | POA: Diagnosis not present

## 2022-04-18 DIAGNOSIS — R102 Pelvic and perineal pain: Secondary | ICD-10-CM | POA: Diagnosis not present

## 2022-04-18 NOTE — Progress Notes (Addendum)
Connie Wheeler 19-Aug-1970 [id number]        52 y.o.  G2P2L2 Married  RP: Referred for TLH/BS d/t Fibroid/Menorrhagia/Lt Pelvic pain  HPI: Referred by Dr Azucena Fallen as patient's insurance not taken by [facility] anymore.  Menorrhagia/hemorrhagia in 10/2021 for which she was brought to the ER.  Following that episode Dr Benjie Karvonen performed an EBx which was benign in 10/2021.  Pelvic US showed an anterior central Fibroid 4+ cm (stable) with glassy appearance, patient has a bicornuate uterus, feels that her pregnancies were on the left side. C/O lower abdominal pain towards the left.  Patient had an Endometrial Ablation in 11/2021 with Dr Benjie Karvonen.  Started bleeding heavily again in 02/2022.  Controlled with Megace.  Not currently bleeding. Stopped Megace when the bleeding resolved.  Last Hb 13.9 on 04/07/22.  OB History  Gravida Para Term Preterm AB Living  '2 2   2   2  '$ SAB IAB Ectopic Multiple Live Births          2    # Outcome Date GA Lbr Len/2nd Weight Sex Delivery Anes PTL Lv  2 Preterm           1 Preterm             Past medical history,surgical history, problem list, medications, allergies, family history and social history were all reviewed and documented in the EPIC chart.   Directed ROS with pertinent positives and negatives documented in the history of present illness/assessment and plan.  Exam:  Vitals:   04/18/22 1110  BP: 114/72  Pulse: 90  SpO2: 98%   General appearance:  Normal  Abdomen: Normal  Gynecologic exam: Vulva normal.  Bimanual exam:  Uterus AV, mildly enlarged, mobile, NT.  No adnexal mass felt, NT bilaterally.  No blood, no vaginal discharge.   Assessment/Plan:  52 y.o. G2P2L2   1. Menorrhagia with regular cycle Referred by Dr Azucena Fallen as patient's insurance not taken by [facility] anymore.  Menorrhagia/hemorrhagia in 10/2021 for which she was brought to the ER.  Following that episode Dr Benjie Karvonen performed an EBx which was benign in 10/2021.   Pelvic US showed an anterior central Fibroid 4+ cm (stable) with glassy appearance, patient has a bicornuate uterus, feels that her pregnancies were on the left side. C/O lower abdominal pain towards the left.  Patient had an Endometrial Ablation in 11/2021 with Dr Benjie Karvonen.  Started bleeding heavily again in 02/2022.  Controlled with Megace.  Not currently bleeding. Stopped Megace when the bleeding resolved.  Last Hb 13.9 on 04/07/22. Gyn exam with only a mildly enlarged uterus, mobile.  No adnexal mass.  No vaginal bleeding currently.  Will further investigate with a Pelvic MRI because of the "glassy" appearance of the Fibroid and the fact that patient bled like a hemorrhage in 10/2021 and then had heavy bleeding post Endometrial Ablation.  If pelvic MRI is reassuring, will proceed with an XI Robotic TLH/BS.  Surgery and risks reviewed.  Patient voiced understanding and agreement with the plan.  Will do a Video Preop visit after the pelvic MRI. - MR PELVIS W CONTRAST; Future  2. Intramural leiomyoma of uterus Uterine Fibroid 4+ cm with glassy appearance.  Pretty stable in size by Pelvic US.  Hemorrhage 10/2021.  EBx benign.  Heavy bleeding post Endometrial Ablation.  Further investigation with Pelvic MRI. - MR PELVIS W CONTRAST; Future  3. Pelvic pain in female Mild left pelvic discomfort.  NT on Bimanual exam. -  MR PELVIS W CONTRAST; Future  4. S/P endometrial ablation Heavy vaginal bleeding post endometrial ablation.  Other orders - megestrol (MEGACE) 20 MG tablet; Take 20 mg by mouth daily. (Patient not taking: Reported on 04/18/2022)   Princess Bruins MD, 11:21 AM 04/18/2022

## 2022-04-19 ENCOUNTER — Encounter: Payer: Self-pay | Admitting: Obstetrics & Gynecology

## 2022-04-19 ENCOUNTER — Telehealth: Payer: Self-pay | Admitting: *Deleted

## 2022-04-19 NOTE — Telephone Encounter (Signed)
Call returned to patient.   Left message stating Surgery date confirmed for 05/30/22  Advised patient I will forward to business office for return call. I will return call once surgery date and time confirmed.

## 2022-04-19 NOTE — Telephone Encounter (Signed)
Spoke with patient, reviewed possible surgery dates, will f/u once confirmed with OR.  Instructed patient to contact [facility] to schedule MRI pelvis, number provided. Patient aware to return call if any assistance needed in the meantime.

## 2022-04-19 NOTE — Telephone Encounter (Signed)
-----   Message from Princess Bruins, MD sent at 04/18/2022 11:57 AM EST ----- Regarding: Schedule Pelvic MRI/Preop Video-visit and Surgery Robotic TLH/BS 4/2nd or 4/3rd with [facility]: CPT [id number] - XI Robotic Total Laparoscopic Hysterectomy with Bilateral Salpingectomy    Diagnosis: N92.0 Menorrhagia with Regular Cycles, D25.9 Fibroids, Pelvic pain  Location: [facility]  Status: Outpatient  Time: 120 Minutes  Assistant: Leana Roe  Urgency: First Available 4/2nd or 4/3rd  Pre-Op Appointment: To Be Scheduled by Video-visit port Pelvic MRI  Post-Op Appointment(s): 2 Weeks, 6 Weeks  Time Out Of Work: 6 Weeks

## 2022-04-19 NOTE — Telephone Encounter (Signed)
Surgery request sent for 05/30/22  MRI scheduled for 05/12/22

## 2022-04-24 NOTE — Telephone Encounter (Signed)
Spoke with patient. States she will return call later in the morning.

## 2022-04-30 NOTE — Telephone Encounter (Signed)
Spoke with patient. Surgery date request confirmed.  Advised surgery is scheduled for 05/30/22, [facility] at 11am.  Surgery instruction sheet and hospital brochure reviewed, printed copy will be mailed.  Patient verbalizes understanding and is agreeable.  Routing to provider. Encounter closed.

## 2022-05-12 ENCOUNTER — Ambulatory Visit
Admission: RE | Admit: 2022-05-12 | Discharge: 2022-05-12 | Disposition: A | Discharge status: Home / Self Care | Payer: Managed Care, Other (non HMO) | Source: Ambulatory Visit | Attending: Obstetrics & Gynecology | Admitting: Obstetrics & Gynecology

## 2022-05-12 DIAGNOSIS — N920 Excessive and frequent menstruation with regular cycle: Secondary | ICD-10-CM

## 2022-05-12 DIAGNOSIS — R102 Pelvic and perineal pain: Secondary | ICD-10-CM

## 2022-05-12 DIAGNOSIS — D251 Intramural leiomyoma of uterus: Secondary | ICD-10-CM

## 2022-05-12 MED ORDER — GADOPICLENOL 0.5 MMOL/ML IV SOLN
6.0000 mL | Freq: Once | INTRAVENOUS | Status: AC | PRN
  Administered 2022-05-12: 6 mL via INTRAVENOUS

## 2022-05-14 ENCOUNTER — Ambulatory Visit (INDEPENDENT_AMBULATORY_CARE_PROVIDER_SITE_OTHER): Payer: Managed Care, Other (non HMO) | Admitting: Obstetrics & Gynecology

## 2022-05-14 ENCOUNTER — Encounter: Payer: Self-pay | Admitting: Obstetrics & Gynecology

## 2022-05-14 VITALS — BP 110/78 | HR 87

## 2022-05-14 DIAGNOSIS — N920 Excessive and frequent menstruation with regular cycle: Secondary | ICD-10-CM

## 2022-05-14 DIAGNOSIS — D251 Intramural leiomyoma of uterus: Secondary | ICD-10-CM | POA: Diagnosis not present

## 2022-05-14 DIAGNOSIS — Z9889 Other specified postprocedural states: Secondary | ICD-10-CM

## 2022-05-14 DIAGNOSIS — R102 Pelvic and perineal pain: Secondary | ICD-10-CM | POA: Diagnosis not present

## 2022-05-14 NOTE — Progress Notes (Signed)
Connie Wheeler [birth year] [id number]        52 y.o.  G2P0202   RP: Preop XI Robotic TLH with bilateral Salpingectomy on 05/30/22  HPI: No change x visit on 04/18/22.  MRI of pelvis done 05/12/22, results pending.   Referred by Dr Azucena Fallen as patient's insurance not taken by [facility] anymore.  Menorrhagia/hemorrhagia in 10/2021 for which she was brought to the ER.  Following that episode Dr Benjie Karvonen performed an EBx which was benign in 10/2021.  Pelvic US showed an anterior central Fibroid 4+ cm (stable) with glassy appearance, patient has a bicornuate uterus, feels that her pregnancies were on the left side. C/O lower abdominal pain towards the left.  Patient had an Endometrial Ablation in 11/2021 with Dr Benjie Karvonen.  Started bleeding heavily again in 02/2022.  Controlled with Megace.  Not currently bleeding. Stopped Megace when the bleeding resolved.  Last Hb 13.9 on 04/07/22.    OB History  Gravida Para Term Preterm AB Living  2 2   2   2   SAB IAB Ectopic Multiple Live Births          2    # Outcome Date GA Lbr Len/2nd Weight Sex Delivery Anes PTL Lv  2 Preterm           1 Preterm             Past medical history,surgical history, problem list, medications, allergies, family history and social history were all reviewed and documented in the EPIC chart.   Directed ROS with pertinent positives and negatives documented in the history of present illness/assessment and plan.  Exam:  Vitals:   05/14/22 1631  BP: 110/78  Pulse: 87  SpO2: 97%   General appearance:  Normal  Gynecologic exam 04/18/22: Vulva normal.  Bimanual exam:  Uterus AV, mildly enlarged, mobile, NT.  No adnexal mass felt, NT bilaterally.  No blood, no vaginal discharge.     Assessment/Plan:  52 y.o. G2P2L2    1. Menorrhagia with regular cycle Referred by Dr Azucena Fallen as patient's insurance not taken by [facility] anymore.  Menorrhagia/hemorrhagia in 10/2021 for which she was brought to the ER.   Following that episode Dr Benjie Karvonen performed an EBx which was benign in 10/2021.  Pelvic US showed an anterior central Fibroid 4+ cm (stable) with glassy appearance, patient has a bicornuate uterus, feels that her pregnancies were on the left side. C/O lower abdominal pain towards the left.  Patient had an Endometrial Ablation in 11/2021 with Dr Benjie Karvonen.  Started bleeding heavily again in 02/2022.  Controlled with Megace.  Not currently bleeding. Stopped Megace when the bleeding resolved.  Last Hb 13.9 on 04/07/22. Gyn exam with only a mildly enlarged uterus, mobile.  No adnexal mass.  No vaginal bleeding currently.  Will further investigate with a Pelvic MRI because of the "glassy" appearance of the Fibroid and the fact that patient bled like a hemorrhage in 10/2021 and then had heavy bleeding post Endometrial Ablation.  Decision to proceed with an XI Robotic TLH/BS.  Preop preparation, Surgery and risks, as well as postop precautions and expectations reviewed thoroughly.  Patient voiced understanding and agreement with the plan.  MRI results pending, done 05/12/22, will review before surgery.   2. Intramural leiomyoma of uterus Uterine Fibroid 4+ cm with glassy appearance.  Pretty stable in size by Pelvic US.  Hemorrhage 10/2021.  EBx benign.  Heavy bleeding post Endometrial Ablation.  Further investigation with Pelvic MRI.  MRI results pending, done 05/12/22.   3. Pelvic pain in female Mild left pelvic discomfort.  NT on Bimanual exam.  MRI results pending, done 05/12/22.   4. S/P endometrial ablation Heavy vaginal bleeding post endometrial ablation.   Other orders - megestrol (MEGACE) 20 MG tablet; Take 20 mg by mouth daily. (Patient not taking: Reported on 04/18/2022)                         Patient was counseled as to the risk of surgery to include the following:  1. Infection (prohylactic antibiotics will be administered)  2. DVT/Pulmonary Embolism (prophylactic pneumo compression stockings will be  used)  3.Trauma to internal organs requiring additional surgical procedure to repair any injury to internal organs requiring perhaps additional hospitalization days.  4.Hemmorhage requiring transfusion and blood products which carry risks such as  anaphylactic reaction, hepatitis and AIDS  Patient had received literature information on the procedure scheduled and all her questions were answered and fully accepts all risk.   Princess Bruins MD, 4:34 PM 05/14/2022

## 2022-05-23 ENCOUNTER — Encounter (HOSPITAL_BASED_OUTPATIENT_CLINIC_OR_DEPARTMENT_OTHER): Payer: Self-pay | Admitting: Obstetrics & Gynecology

## 2022-05-23 ENCOUNTER — Encounter: Payer: Self-pay | Admitting: Obstetrics & Gynecology

## 2022-05-23 DIAGNOSIS — Z0289 Encounter for other administrative examinations: Secondary | ICD-10-CM

## 2022-05-23 NOTE — Progress Notes (Signed)
Spoke w/ via phone for pre-op interview--- pt Lab needs dos----   urine preg            Lab results------ pt has lab appt 05-28-2022 @ 0830, getting CBC/ T&S COVID test -----patient states asymptomatic no test needed Arrive at ------- 0900 on 05-30-2022 NPO after MN  Med rec completed Medications to take morning of surgery ----- none Diabetic medication ----- n/a Patient instructed no nail polish to be worn day of surgery Patient instructed to bring photo id and insurance card day of surgery Patient aware to have Driver (ride ) / caregiver    for 24 hours after surgery -- husband, kristopher Patient Special Instructions ----- pt will pick up bag w/ hibiclens and instructions at lab appt Pre-Op special Istructions ----- pt stated she will be getting prn meds for her IC from Dr Mattie Marlin for possible flare up, she will bring list w/ name, dosage, and how prescribed with her dos. Patient verbalized understanding of instructions that were given at this phone interview. Patient denies shortness of breath, chest pain, fever, cough at this phone interview.

## 2022-05-23 NOTE — Telephone Encounter (Signed)
Disability forms completed and faxed.  Patient notified.

## 2022-05-23 NOTE — Progress Notes (Signed)
Your procedure is scheduled on :  Wednesday,  05-30-2022  Report to [facility]  __9:00_ AM.   Call this number if you have problems the morning of surgery  [phone].   OUR ADDRESS IS [address].  WE ARE LOCATED IN [facility].  PLEASE BRING YOUR INSURANCE CARD AND PHOTO ID DAY OF SURGERY.  ONLY 2 PEOPLE ARE ALLOWED IN  WAITING  ROOM .                                     REMEMBER:  DO NOT EAT FOOD OR LIQUIDS  AFTER MIDNIGHT THE NIGHT BEFORE YOUR SURGERY.  THIS INCLUDES NO CANDY/ GUM/ MINTS. YOU MAY  BRUSH YOUR TEETH MORNING OF SURGERY AND RINSE YOUR MOUTH OUT. _____________________________________________     TAKE ONLY THESE MEDICATIONS MORNING OF SURGERY:  NONE Please bring rescue inhaler with you day of surgery    UP TO 4 VISITORS  MAY VISIT IN THE EXTENDED RECOVERY ROOM UNTIL 800 PM ONLY.  ONE  VISITOR AGE 18 AND OVER MAY SPEND THE NIGHT AND MUST BE IN EXTENDED RECOVERY ROOM NO LATER THAN 800 PM . YOUR DISCHARGE TIME AFTER YOU SPEND THE NIGHT IS 900 AM THE MORNING AFTER YOUR SURGERY.  YOU MAY PACK A SMALL OVERNIGHT BAG WITH TOILETRIES FOR YOUR OVERNIGHT STAY IF YOU WISH.  YOUR PRESCRIPTION MEDICATIONS WILL BE PROVIDED DURING [place].                                      DO NOT WEAR JEWERLY/  METAL/  PIERCINGS (INCLUDING NO PLASTIC PIERCINGS) DO NOT WEAR LOTIONS, POWDERS, PERFUMES OR NAIL POLISH ON YOUR FINGERNAILS. TOENAIL POLISH IS OK TO WEAR. DO NOT SHAVE FOR 48 HOURS PRIOR TO DAY OF SURGERY.  CONTACTS, GLASSES, OR DENTURES MAY NOT BE WORN TO SURGERY.  REMEMBER: NO SMOKING, VAPING ,  DRUGS OR ALCOHOL FOR 24 HOURS BEFORE YOUR SURGERY.                                    [facility] IS NOT RESPONSIBLE  FOR ANY BELONGINGS.                                                                    Marland Kitchen           [facility] - Preparing for Surgery Before surgery, you can play an important role.  Because skin is not  sterile, your skin needs to be as free of germs as possible.  You can reduce the number of germs on your skin by washing with CHG (chlorahexidine gluconate) soap before surgery.  CHG is an antiseptic cleaner which kills germs and bonds with the skin to continue killing germs even after washing. Please DO NOT use if you have an allergy to CHG or antibacterial soaps.  If your skin becomes reddened/irritated stop using the CHG and inform your nurse when you arrive at Short Stay.  Do not shave (including legs and underarms) for at least 48 hours prior to the first CHG shower.  You may shave your face/neck. Please follow these instructions carefully:  1.  Shower with CHG Soap the night before surgery and the  morning of Surgery.  2.  If you choose to wash your hair, wash your hair first as usual with your  normal  shampoo.  3.  After you shampoo, rinse your hair and body thoroughly to remove the  shampoo.                                        4.  Use CHG as you would any other liquid soap.  You can apply chg directly  to the skin and wash , chg soap provided, night before and morning of your surgery.  5.  Apply the CHG Soap to your body ONLY FROM THE NECK DOWN.   Do not use on face/ open                           Wound or open sores. Avoid contact with eyes, ears mouth and genitals (private parts).                       Wash face,  Genitals (private parts) with your normal soap.             6.  Wash thoroughly, paying special attention to the area where your surgery  will be performed.  7.  Thoroughly rinse your body with warm water from the neck down.  8.  DO NOT shower/wash with your normal soap after using and rinsing off  the CHG Soap.             9.  Pat yourself dry with a clean towel.            10.  Wear clean pajamas.            11.  Place clean sheets on your bed the night of your first shower and do not  sleep with pets. Day of Surgery : Do not apply any lotions/ powders the morning of  surgery.  Please wear clean clothes to the hospital/surgery center.  IF YOU HAVE ANY SKIN IRRITATION OR PROBLEMS WITH THE SURGICAL SOAP, PLEASE GET A BAR OF GOLD DIAL SOAP AND SHOWER THE NIGHT BEFORE YOUR SURGERY AND THE MORNING OF YOUR SURGERY. PLEASE LET THE NURSE KNOW MORNING OF YOUR SURGERY IF YOU HAD ANY PROBLEMS WITH THE SURGICAL SOAP.   ________________________________________________________________________                                                        QUESTIONS Daphene Jaeger PRE OP NURSE PHONE [phone].

## 2022-05-28 ENCOUNTER — Encounter (HOSPITAL_COMMUNITY)
Admission: RE | Admit: 2022-05-28 | Discharge: 2022-05-28 | Disposition: A | Discharge status: Home / Self Care | Payer: Managed Care, Other (non HMO) | Source: Ambulatory Visit | Attending: Obstetrics & Gynecology | Admitting: Obstetrics & Gynecology

## 2022-05-28 DIAGNOSIS — Z01818 Encounter for other preprocedural examination: Secondary | ICD-10-CM | POA: Diagnosis not present

## 2022-05-28 LAB — CBC
HCT: 42.6 % (ref 36.0–46.0)
Hemoglobin: 13.5 g/dL (ref 12.0–15.0)
MCH: 28.7 pg (ref 26.0–34.0)
MCHC: 31.7 g/dL (ref 30.0–36.0)
MCV: 90.6 fL (ref 80.0–100.0)
Platelets: 261 10*3/uL (ref 150–400)
RBC: 4.7 MIL/uL (ref 3.87–5.11)
RDW: 12.6 % (ref 11.5–15.5)
WBC: 7.2 10*3/uL (ref 4.0–10.5)
nRBC: 0 % (ref 0.0–0.2)

## 2022-05-29 NOTE — Anesthesia Preprocedure Evaluation (Signed)
Anesthesia Evaluation  Patient identified by MRN, date of birth, ID band Patient awake    Reviewed: Allergy & Precautions, H&P , NPO status , Patient's Chart, lab work & pertinent test results  History of Anesthesia Complications (+) Family history of anesthesia reaction  Airway Mallampati: II  TM Distance: >3 FB Neck ROM: Full    Dental no notable dental hx. (+) Teeth Intact, Dental Advisory Given   Pulmonary asthma    Pulmonary exam normal breath sounds clear to auscultation       Cardiovascular Exercise Tolerance: Good negative cardio ROS  Rhythm:Regular Rate:Normal     Neuro/Psych  Headaches  Anxiety Depression       GI/Hepatic negative GI ROS, Neg liver ROS,,,  Endo/Other  negative endocrine ROS    Renal/GU negative Renal ROS  negative genitourinary   Musculoskeletal   Abdominal   Peds  Hematology  (+) Blood dyscrasia, anemia   Anesthesia Other Findings   Reproductive/Obstetrics negative OB ROS                             Anesthesia Physical Anesthesia Plan  ASA: 2  Anesthesia Plan: General   Post-op Pain Management: Tylenol PO (pre-op)* and Toradol IV (intra-op)*   Induction: Intravenous  PONV Risk Score and Plan: 4 or greater and Ondansetron, Dexamethasone and Midazolam  Airway Management Planned: Oral ETT  Additional Equipment:   Intra-op Plan:   Post-operative Plan: Extubation in OR  Informed Consent: I have reviewed the patients History and Physical, chart, labs and discussed the procedure including the risks, benefits and alternatives for the proposed anesthesia with the patient or authorized representative who has indicated his/her understanding and acceptance.     Dental advisory given  Plan Discussed with: CRNA  Anesthesia Plan Comments:        Anesthesia Quick Evaluation

## 2022-05-30 ENCOUNTER — Encounter (HOSPITAL_BASED_OUTPATIENT_CLINIC_OR_DEPARTMENT_OTHER): Payer: Self-pay | Admitting: Obstetrics & Gynecology

## 2022-05-30 ENCOUNTER — Encounter (HOSPITAL_BASED_OUTPATIENT_CLINIC_OR_DEPARTMENT_OTHER)
Admission: RE | Disposition: A | Discharge status: Home / Self Care | Payer: Self-pay | Source: Home / Self Care | Attending: Obstetrics & Gynecology

## 2022-05-30 ENCOUNTER — Observation Stay (HOSPITAL_BASED_OUTPATIENT_CLINIC_OR_DEPARTMENT_OTHER)
Admission: RE | Admit: 2022-05-30 | Discharge: 2022-05-31 | Disposition: A | Discharge status: Home / Self Care | Payer: Managed Care, Other (non HMO) | Attending: Obstetrics & Gynecology | Admitting: Obstetrics & Gynecology

## 2022-05-30 ENCOUNTER — Other Ambulatory Visit: Payer: Self-pay

## 2022-05-30 ENCOUNTER — Ambulatory Visit (HOSPITAL_BASED_OUTPATIENT_CLINIC_OR_DEPARTMENT_OTHER): Payer: Managed Care, Other (non HMO) | Admitting: Anesthesiology

## 2022-05-30 ENCOUNTER — Other Ambulatory Visit (INDEPENDENT_AMBULATORY_CARE_PROVIDER_SITE_OTHER): Payer: Managed Care, Other (non HMO) | Admitting: Obstetrics & Gynecology

## 2022-05-30 DIAGNOSIS — N921 Excessive and frequent menstruation with irregular cycle: Secondary | ICD-10-CM | POA: Diagnosis not present

## 2022-05-30 DIAGNOSIS — K660 Peritoneal adhesions (postprocedural) (postinfection): Secondary | ICD-10-CM

## 2022-05-30 DIAGNOSIS — Z01818 Encounter for other preprocedural examination: Secondary | ICD-10-CM

## 2022-05-30 DIAGNOSIS — R102 Pelvic and perineal pain: Secondary | ICD-10-CM | POA: Diagnosis not present

## 2022-05-30 DIAGNOSIS — Z9889 Other specified postprocedural states: Secondary | ICD-10-CM

## 2022-05-30 DIAGNOSIS — Z86718 Personal history of other venous thrombosis and embolism: Secondary | ICD-10-CM | POA: Diagnosis not present

## 2022-05-30 DIAGNOSIS — D259 Leiomyoma of uterus, unspecified: Secondary | ICD-10-CM | POA: Diagnosis not present

## 2022-05-30 DIAGNOSIS — N920 Excessive and frequent menstruation with regular cycle: Secondary | ICD-10-CM | POA: Diagnosis not present

## 2022-05-30 HISTORY — DX: Excessive and frequent menstruation with irregular cycle: N92.1

## 2022-05-30 HISTORY — PX: ROBOTIC ASSISTED LAPAROSCOPIC HYSTERECTOMY AND SALPINGECTOMY: SHX6379

## 2022-05-30 HISTORY — DX: Pelvic and perineal pain: R10.2

## 2022-05-30 LAB — CBC
HCT: 47.3 % — ABNORMAL HIGH (ref 36.0–46.0)
Hemoglobin: 15.1 g/dL — ABNORMAL HIGH (ref 12.0–15.0)
MCH: 29.7 pg (ref 26.0–34.0)
MCHC: 31.9 g/dL (ref 30.0–36.0)
MCV: 93.1 fL (ref 80.0–100.0)
Platelets: 204 10*3/uL (ref 150–400)
RBC: 5.08 MIL/uL (ref 3.87–5.11)
RDW: 12.5 % (ref 11.5–15.5)
WBC: 16.8 10*3/uL — ABNORMAL HIGH (ref 4.0–10.5)
nRBC: 0 % (ref 0.0–0.2)

## 2022-05-30 LAB — TYPE AND SCREEN
ABO/RH(D): A POS
Antibody Screen: NEGATIVE

## 2022-05-30 LAB — POCT PREGNANCY, URINE: Preg Test, Ur: NEGATIVE

## 2022-05-30 SURGERY — XI ROBOTIC ASSISTED LAPAROSCOPIC HYSTERECTOMY AND SALPINGECTOMY
Anesthesia: General | Site: Abdomen | Laterality: Bilateral

## 2022-05-30 MED ORDER — ACETAMINOPHEN 325 MG PO TABS: 650.0000 mg | ORAL_TABLET | ORAL | Status: DC | PRN

## 2022-05-30 MED ORDER — LACTATED RINGERS IV SOLN: INTRAVENOUS | Status: DC

## 2022-05-30 MED ORDER — FENTANYL CITRATE (PF) 250 MCG/5ML IJ SOLN
INTRAMUSCULAR | Status: AC
  Filled 2022-05-30: qty 5

## 2022-05-30 MED ORDER — ROCURONIUM BROMIDE 100 MG/10ML IV SOLN
INTRAVENOUS | Status: DC | PRN
  Administered 2022-05-30: 60 mg via INTRAVENOUS
  Administered 2022-05-30 (×2): 10 mg via INTRAVENOUS

## 2022-05-30 MED ORDER — PHENYLEPHRINE HCL (PRESSORS) 10 MG/ML IV SOLN
INTRAVENOUS | Status: DC | PRN
  Administered 2022-05-30 (×5): 80 ug via INTRAVENOUS

## 2022-05-30 MED ORDER — LACTATED RINGERS IV SOLN
INTRAVENOUS | Status: DC
  Administered 2022-05-30: 1000 mL via INTRAVENOUS

## 2022-05-30 MED ORDER — SUGAMMADEX SODIUM 200 MG/2ML IV SOLN
INTRAVENOUS | Status: DC | PRN
  Administered 2022-05-30: 200 mg via INTRAVENOUS

## 2022-05-30 MED ORDER — DEXAMETHASONE SODIUM PHOSPHATE 4 MG/ML IJ SOLN
INTRAMUSCULAR | Status: DC | PRN
  Administered 2022-05-30: 8 mg via INTRAVENOUS

## 2022-05-30 MED ORDER — LIDOCAINE HCL (PF) 2 % IJ SOLN
INTRAMUSCULAR | Status: AC
  Filled 2022-05-30: qty 10

## 2022-05-30 MED ORDER — HYDROCODONE-ACETAMINOPHEN 5-325 MG PO TABS
ORAL_TABLET | ORAL | Status: AC
  Filled 2022-05-30: qty 2

## 2022-05-30 MED ORDER — HYDROMORPHONE HCL 1 MG/ML IJ SOLN
INTRAMUSCULAR | Status: AC
  Filled 2022-05-30: qty 1

## 2022-05-30 MED ORDER — PROPOFOL 10 MG/ML IV BOLUS
INTRAVENOUS | Status: DC | PRN
  Administered 2022-05-30: 120 mg via INTRAVENOUS

## 2022-05-30 MED ORDER — ACETAMINOPHEN 500 MG PO TABS
1000.0000 mg | ORAL_TABLET | Freq: Once | ORAL | Status: AC
  Administered 2022-05-30: 1000 mg via ORAL

## 2022-05-30 MED ORDER — PROPOFOL 10 MG/ML IV BOLUS
INTRAVENOUS | Status: AC
  Filled 2022-05-30: qty 20

## 2022-05-30 MED ORDER — OXYCODONE-ACETAMINOPHEN 5-325 MG PO TABS
2.0000 | ORAL_TABLET | ORAL | Status: DC | PRN
  Administered 2022-05-30 (×2): 2 via ORAL

## 2022-05-30 MED ORDER — ONDANSETRON HCL 4 MG/2ML IJ SOLN
INTRAMUSCULAR | Status: DC | PRN
  Administered 2022-05-30: 4 mg via INTRAVENOUS

## 2022-05-30 MED ORDER — OXYCODONE-ACETAMINOPHEN 5-325 MG PO TABS
ORAL_TABLET | ORAL | Status: AC
  Filled 2022-05-30: qty 2

## 2022-05-30 MED ORDER — BUPIVACAINE HCL (PF) 0.25 % IJ SOLN
INTRAMUSCULAR | Status: DC | PRN
  Administered 2022-05-30: 26 mL

## 2022-05-30 MED ORDER — MIDAZOLAM HCL 2 MG/2ML IJ SOLN
INTRAMUSCULAR | Status: AC
  Filled 2022-05-30: qty 2

## 2022-05-30 MED ORDER — DEXMEDETOMIDINE HCL IN NACL 80 MCG/20ML IV SOLN
INTRAVENOUS | Status: DC | PRN
  Administered 2022-05-30: 4 ug via BUCCAL
  Administered 2022-05-30: 8 ug via BUCCAL
  Administered 2022-05-30: 4 ug via BUCCAL

## 2022-05-30 MED ORDER — ONDANSETRON HCL 4 MG/2ML IJ SOLN
INTRAMUSCULAR | Status: AC
  Filled 2022-05-30: qty 4

## 2022-05-30 MED ORDER — LIDOCAINE HCL (CARDIAC) PF 100 MG/5ML IV SOSY
PREFILLED_SYRINGE | INTRAVENOUS | Status: DC | PRN
  Administered 2022-05-30: 60 mg via INTRAVENOUS

## 2022-05-30 MED ORDER — FENTANYL CITRATE (PF) 100 MCG/2ML IJ SOLN
INTRAMUSCULAR | Status: DC | PRN
  Administered 2022-05-30 (×3): 50 ug via INTRAVENOUS
  Administered 2022-05-30: 100 ug via INTRAVENOUS

## 2022-05-30 MED ORDER — SODIUM CHLORIDE 0.9 % IR SOLN
Status: DC | PRN
  Administered 2022-05-30: 1000 mL

## 2022-05-30 MED ORDER — MIDAZOLAM HCL 2 MG/2ML IJ SOLN
INTRAMUSCULAR | Status: DC | PRN
  Administered 2022-05-30: 2 mg via INTRAVENOUS

## 2022-05-30 MED ORDER — ROCURONIUM BROMIDE 10 MG/ML (PF) SYRINGE
PREFILLED_SYRINGE | INTRAVENOUS | Status: AC
  Filled 2022-05-30: qty 30

## 2022-05-30 MED ORDER — SODIUM CHLORIDE (PF) 0.9 % IJ SOLN
INTRAMUSCULAR | Status: AC
  Filled 2022-05-30: qty 40

## 2022-05-30 MED ORDER — HYDROCODONE-ACETAMINOPHEN 5-325 MG PO TABS
1.0000 | ORAL_TABLET | ORAL | Status: DC | PRN
  Administered 2022-05-30 (×2): 2 via ORAL
  Administered 2022-05-31: 1 via ORAL

## 2022-05-30 MED ORDER — DEXAMETHASONE SODIUM PHOSPHATE 10 MG/ML IJ SOLN
INTRAMUSCULAR | Status: AC
  Filled 2022-05-30: qty 2

## 2022-05-30 MED ORDER — OXYCODONE-ACETAMINOPHEN 7.5-325 MG PO TABS: 1.0000 | ORAL_TABLET | Freq: Four times a day (QID) | ORAL | 0 refills | Status: DC | PRN

## 2022-05-30 MED ORDER — CEFAZOLIN SODIUM-DEXTROSE 2-4 GM/100ML-% IV SOLN
2.0000 g | INTRAVENOUS | Status: AC
  Administered 2022-05-30: 2 g via INTRAVENOUS

## 2022-05-30 MED ORDER — ACETAMINOPHEN 500 MG PO TABS
ORAL_TABLET | ORAL | Status: AC
  Filled 2022-05-30: qty 1

## 2022-05-30 MED ORDER — POVIDONE-IODINE 10 % EX SWAB: 2.0000 | Freq: Once | CUTANEOUS | Status: DC

## 2022-05-30 MED ORDER — STERILE WATER FOR IRRIGATION IR SOLN
Status: DC | PRN
  Administered 2022-05-30: 500 mL

## 2022-05-30 MED ORDER — CEFAZOLIN SODIUM-DEXTROSE 2-4 GM/100ML-% IV SOLN
INTRAVENOUS | Status: AC
  Filled 2022-05-30: qty 100

## 2022-05-30 MED ORDER — HYDROMORPHONE HCL 1 MG/ML IJ SOLN
0.2500 mg | INTRAMUSCULAR | Status: DC | PRN
  Administered 2022-05-30 (×4): 0.5 mg via INTRAVENOUS

## 2022-05-30 SURGICAL SUPPLY — 56 items
ADH SKN CLS APL DERMABOND .7 (GAUZE/BANDAGES/DRESSINGS) ×1
CATH FOLEY 3WAY  5CC 16FR (CATHETERS) ×1
CATH FOLEY 3WAY 5CC 16FR (CATHETERS) ×2 IMPLANT
COVER BACK TABLE 60X90IN (DRAPES) ×2 IMPLANT
COVER TIP SHEARS 8 DVNC (MISCELLANEOUS) ×2 IMPLANT
COVER TIP SHEARS 8MM DA VINCI (MISCELLANEOUS) ×1
DEFOGGER SCOPE WARMER CLEARIFY (MISCELLANEOUS) ×2 IMPLANT
DERMABOND ADVANCED .7 DNX12 (GAUZE/BANDAGES/DRESSINGS) ×2 IMPLANT
DRAPE ARM DVNC X/XI (DISPOSABLE) ×8 IMPLANT
DRAPE COLUMN DVNC XI (DISPOSABLE) ×2 IMPLANT
DRAPE DA VINCI XI ARM (DISPOSABLE) ×4
DRAPE DA VINCI XI COLUMN (DISPOSABLE) ×1
DRAPE SURG IRRIG POUCH 19X23 (DRAPES) ×2 IMPLANT
DRAPE UTILITY XL STRL (DRAPES) ×2 IMPLANT
DURAPREP 26ML APPLICATOR (WOUND CARE) ×2 IMPLANT
ELECT REM PT RETURN 9FT ADLT (ELECTROSURGICAL) ×1
ELECTRODE REM PT RTRN 9FT ADLT (ELECTROSURGICAL) ×2 IMPLANT
GAUZE 4X4 16PLY ~~LOC~~+RFID DBL (SPONGE) IMPLANT
GAUZE VASELINE 3X9 (GAUZE/BANDAGES/DRESSINGS) ×2 IMPLANT
GLOVE BIO SURGEON STRL SZ 6.5 (GLOVE) ×6 IMPLANT
GLOVE BIO SURGEON STRL SZ7 (GLOVE) IMPLANT
GLOVE BIOGEL PI IND STRL 7.0 (GLOVE) ×6 IMPLANT
GLOVE SURG SS PI 7.5 STRL IVOR (GLOVE) IMPLANT
GOWN SRG XL LVL 3 REINF SET IN (GOWNS) IMPLANT
GOWN STRL REIN XL LVL3 (GOWNS) ×2
HOLDER FOLEY CATH W/STRAP (MISCELLANEOUS) IMPLANT
IRRIG SUCT STRYKERFLOW 2 WTIP (MISCELLANEOUS) ×1
IRRIGATION SUCT STRKRFLW 2 WTP (MISCELLANEOUS) ×2 IMPLANT
IV NS 1000ML (IV SOLUTION) ×1
IV NS 1000ML BAXH (IV SOLUTION) IMPLANT
KIT PINK PAD W/HEAD ARE REST (MISCELLANEOUS) ×1
KIT PINK PAD W/HEAD ARM REST (MISCELLANEOUS) ×2 IMPLANT
KIT TURNOVER CYSTO (KITS) ×2 IMPLANT
LEGGING LITHOTOMY PAIR STRL (DRAPES) ×2 IMPLANT
OBTURATOR OPTICAL STANDARD 8MM (TROCAR) ×1
OBTURATOR OPTICAL STND 8 DVNC (TROCAR) ×1
OBTURATOR OPTICALSTD 8 DVNC (TROCAR) ×2 IMPLANT
OCCLUDER COLPOPNEUMO (BALLOONS) ×2 IMPLANT
PACK ROBOT WH (CUSTOM PROCEDURE TRAY) ×2 IMPLANT
PACK ROBOTIC GOWN (GOWN DISPOSABLE) ×2 IMPLANT
PAD OB MATERNITY 4.3X12.25 (PERSONAL CARE ITEMS) ×2 IMPLANT
PAD PREP 24X48 CUFFED NSTRL (MISCELLANEOUS) ×2 IMPLANT
PROTECTOR NERVE ULNAR (MISCELLANEOUS) ×4 IMPLANT
SEAL UNIV 5-12 XI (MISCELLANEOUS) ×8 IMPLANT
SEAL XI UNIVERSAL 5-12 (MISCELLANEOUS) ×4
SET TRI-LUMEN FLTR TB AIRSEAL (TUBING) ×2 IMPLANT
SLEEVE SCD COMPRESS KNEE MED (STOCKING) ×2 IMPLANT
SPIKE FLUID TRANSFER (MISCELLANEOUS) ×4 IMPLANT
SUT VIC AB 4-0 PS2 27 (SUTURE) ×6 IMPLANT
SUT VICRYL 0 UR6 27IN ABS (SUTURE) ×2 IMPLANT
SUT VLOC 180 0 9IN  GS21 (SUTURE) ×1
SUT VLOC 180 0 9IN GS21 (SUTURE) ×2 IMPLANT
TIP UTERINE 6.7X8CM BLUE DISP (MISCELLANEOUS) IMPLANT
TOWEL OR 17X24 6PK STRL BLUE (TOWEL DISPOSABLE) ×2 IMPLANT
TROCAR PORT AIRSEAL 8X120 (TROCAR) ×2 IMPLANT
WATER STERILE IRR 500ML POUR (IV SOLUTION) IMPLANT

## 2022-05-30 NOTE — H&P (Addendum)
Connie Wheeler is an 52 y.o. female.G2P2L2    RP: XI Robotic TLH with bilateral Salpingectomy on 05/30/22   HPI: No change x visit on 05/14/22.  MRI of pelvis done 05/12/22, benign appearing Fibroid.  Referred by Dr Azucena Fallen as patient's insurance not taken by [facility] anymore.  Menorrhagia/hemorrhagia in 10/2021 for which she was brought to the ER.  Following that episode Dr Benjie Karvonen performed an EBx which was benign in 10/2021.  Pelvic US showed an anterior central Fibroid 4+ cm (stable) with glassy appearance, patient has a bicornuate uterus, feels that her pregnancies were on the left side. C/O lower abdominal pain towards the left.  Patient had an Endometrial Ablation in 11/2021 with Dr Benjie Karvonen.  Started bleeding heavily again in 02/2022.  Controlled with Megace.  Not currently bleeding. Stopped Megace when the bleeding resolved.  Last Hb 13.9 on 04/07/22.    Pertinent Gynecological History: Menses:  Menometro Contraception: Abstinent x >2 weeks, UPT Neg Blood transfusions: none Sexually transmitted diseases: no past history Previous GYN Procedures:  Endometrial ablation   Last mammogram: normal  Last pap: normal   Menstrual History: Patient's last menstrual period was 04/28/2022 (exact date).    Past Medical History:  Diagnosis Date   Chronic interstitial cystitis    urologist--- dr Alfonso Patten. Connie Wheeler    (03/ 2024  per pt in remission on elmiron)   Exercise-induced asthma    Family history of adverse reaction to anesthesia    mother---  very hard to wake   GAD (generalized anxiety disorder)    IDA (iron deficiency anemia)    Intramural uterine fibroid    Lower urinary tract symptoms (LUTS)    MDD (major depressive disorder)    Menometrorrhagia    Migraines    Pelvic pain in female    Septate uterus     Past Surgical History:  Procedure Laterality Date   DILITATION & CURRETTAGE/HYSTROSCOPY WITH HYDROTHERMAL ABLATION N/A 12/20/2021   Procedure: HYSTEROSCOPY WITH HYDROTHERMAL  ABLATION;  Surgeon: Azucena Fallen, MD;  Location: [facility];  Service: Gynecology;  Laterality: N/A;   KNEE ARTHROSCOPY Right 2006   MASS EXCISION Left 2001   thigh  (benign)    Family History  Problem Relation Age of Onset   Hypertension Mother    Thyroid disease Mother        hypothyroid   Osteoporosis Mother    Rheum arthritis Father    Renal cancer Father    Thyroid disease Maternal Grandmother        hyperthyroid   Lung cancer Maternal Grandmother    Thyroid disease Paternal Grandfather        hyperthyroid    Social History:  reports that she has never smoked. She has never used smokeless tobacco. She reports that she does not drink alcohol and does not use drugs.  Allergies:  Allergies  Allergen Reactions   Tape     Per pt  "any adhesive tape of any type with strong adhesive it rips skin off and cause a burn"    Medications Prior to Admission  Medication Sig Dispense Refill Last Dose   hydrOXYzine (VISTARIL) 25 MG capsule Take 25 mg by mouth every evening.   05/29/2022   ibuprofen (ADVIL) 200 MG tablet Take 800 mg by mouth every 6 (six) hours as needed for mild pain, moderate pain or cramping.   Past Week   megestrol (MEGACE) 20 MG tablet Take 20 mg by mouth as needed.   Past  Month   pentosan polysulfate (ELMIRON) 100 MG capsule Take 200 mg by mouth at bedtime.   05/29/2022   traZODone (DESYREL) 100 MG tablet Take 100 mg by mouth at bedtime.   05/29/2022   ALBUTEROL IN Inhale into the lungs as needed.   More than a month   aspirin-acetaminophen-caffeine (EXCEDRIN MIGRAINE) 250-250-65 MG tablet Take by mouth every 6 (six) hours as needed for headache.   More than a month    REVIEW OF SYSTEMS: A ROS was performed and pertinent positives and negatives are included in the history. GENERAL: No fevers or chills. HEENT: No change in vision, no earache, sore throat or sinus congestion. NECK: No pain or stiffness. CARDIOVASCULAR: No chest pain or pressure. No  palpitations. PULMONARY: No shortness of breath, cough or wheeze. GASTROINTESTINAL: No abdominal pain, nausea, vomiting or diarrhea, melena or bright red blood per rectum. GENITOURINARY: No urinary frequency, urgency, hesitancy or dysuria. MUSCULOSKELETAL: No joint or muscle pain, no back pain, no recent trauma. DERMATOLOGIC: No rash, no itching, no lesions. ENDOCRINE: No polyuria, polydipsia, no heat or cold intolerance. No recent change in weight. HEMATOLOGICAL: No anemia or easy bruising or bleeding. NEUROLOGIC: No headache, seizures, numbness, tingling or weakness. PSYCHIATRIC: No depression, no loss of interest in normal activity or change in sleep pattern.     Blood pressure 114/76, pulse 76, temperature 98.2 F (36.8 C), temperature source Oral, resp. rate 16, height 5\' 6"  (1.676 m), weight 66.5 kg, last menstrual period 04/28/2022, SpO2 95 %.  Physical Exam:  Results for orders placed or performed during the hospital encounter of 05/30/22 (from the past 24 hour(s))  Pregnancy, urine POC     Status: None   Collection Time: 05/30/22  9:10 AM  Result Value Ref Range   Preg Test, Ur NEGATIVE NEGATIVE   Gynecologic exam 04/18/22: Vulva normal.  Bimanual exam:  Uterus AV, mildly enlarged, mobile, NT.  No adnexal mass felt, NT bilaterally.  No blood, no vaginal discharge.     Assessment/Plan:  52 y.o. G2P2L2    1. Menorrhagia with regular cycle Referred by Dr Azucena Fallen as patient's insurance not taken by [facility] anymore.  Menorrhagia/hemorrhagia in 10/2021 for which she was brought to the ER.  Following that episode Dr Benjie Karvonen performed an EBx which was benign in 10/2021.  Pelvic US showed an anterior central Fibroid 4+ cm (stable) with glassy appearance, patient has a bicornuate uterus, feels that her pregnancies were on the left side. C/O lower abdominal pain towards the left.  Patient had an Endometrial Ablation in 11/2021 with Dr Benjie Karvonen.  Started bleeding heavily again in 02/2022.   Controlled with Megace.  Not currently bleeding. Stopped Megace when the bleeding resolved.  Last Hb 13.9 on 04/07/22. Gyn exam with only a mildly enlarged uterus, mobile.  No adnexal mass.  No vaginal bleeding currently. Pelvic MRI because of the "glassy" appearance of the Fibroid and the fact that patient bled like a hemorrhage in 10/2021 and then had heavy bleeding post Endometrial Ablation.  Decision to proceed with an XI Robotic TLH/BS.  Preop preparation, Surgery and risks, as well as postop precautions and expectations reviewed thoroughly.     2. Intramural leiomyoma of uterus Uterine Fibroid 4+ cm with glassy appearance.  Pretty stable in size by Pelvic US.  Hemorrhage 10/2021.  EBx benign.  Heavy bleeding post Endometrial Ablation.  Pelvic MRI 05/12/22 showed a benign appearing uterine Fibroid.     3. Pelvic pain in female Mild left pelvic discomfort.  NT on Bimanual exam.  MRI results pending, done 05/12/22.   4. S/P endometrial ablation Heavy vaginal bleeding post endometrial ablation.   Other orders - megestrol (MEGACE) 20 MG tablet; Take 20 mg by mouth daily. (Patient not taking: Reported on 04/18/2022)                          Patient was counseled as to the risk of surgery to include the following:   1. Infection (prohylactic antibiotics will be administered)   2. DVT/Pulmonary Embolism (prophylactic pneumo compression stockings will be used)   3.Trauma to internal organs requiring additional surgical procedure to repair any injury to internal organs requiring perhaps additional hospitalization days.   4.Hemmorhage requiring transfusion and blood products which carry risks such as  anaphylactic reaction, hepatitis and AIDS   Patient had received literature information on the procedure scheduled and all her questions were answered and fully accepts all risk.  Marie-Lyne Richell Corker 05/30/2022, 11:34 AM

## 2022-05-30 NOTE — Op Note (Addendum)
Operative Note  05/30/2022  2:28 PM  PATIENT:  Connie Wheeler  52 y.o. female  PRE-OPERATIVE DIAGNOSIS:  Menorrhagia with irregular cycles, fibroids, pelvic pain  POST-OPERATIVE DIAGNOSIS:  Menorrhagia with irregular cycles, fibroids, pelvic pain  PROCEDURE:  Procedure(s): XI ROBOTIC ASSISTED Total LAPAROSCOPIC HYSTERECTOMY AND BILATERAL SALPINGECTOMY, LYSIS OF FILMY ADHESIONS BETWEEN BOWEL AND LEFT LOWER ABDOMINAL SIDE WALL  SURGEON:  Surgeon(s): Princess Bruins, MD  ANESTHESIA:   general  FINDINGS: Uterus with a left fundal fibroid about 4 cm.  Normal tubes.  Ovaries normal.  Mild adhesions between bowel and left lower abdominal side wall.  DESCRIPTION OF OPERATION: Under general anesthesia with endotracheal intubation, the patient is in lithotomy position.  She is prepped with DuraPrep on the abdomen and Betadine on the suprapubic, vulvar and vaginal areas.  Timeout is done.  Patient is draped as usual.  The bimanual exam reveals an anteverted uterus, mildly increased in volume and irregular, mobile.  No adnexal mass felt.  The Foley is put in place in the bladder.  The weighted speculum is inserted in the vagina and the anterior lip of the cervix is grasped with a tenaculum.  The hysterometry is at 8 cm.  A #8 Rumi with the medium size Koh ring are put in place.  The other instruments are removed.  We go to the abdomen.      The supraumbilical area is infiltrated with Marcaine one quarter plain.  A 1.5 cm incision is made with a scalpel.  The aponeurosis is grasped with cokers and the aponeurosis is opened with Mayo scissors under direct vision.  The parietal peritoneum is opened bluntly with a finger.  A pursestring stitch of Vicryl 0 is done on the aponeurosis.  The Sheryle Hail is inserted under direct vision and up pneumoperitoneum is created with CO2.  The camera is inserted at that level.  We confirmed a free abdominal wall anteriorly where the ports will be inserted.  Inspection of the  pelvis reveals a mildly increased size of the uterus with the fibroid at the left fundus measuring about 4 cm.  Bilateral tubes and bilateral ovaries are normal.  Filmy adhesions are present between the bowel and the left lower abdominal sidewall.  The camera is removed.  All port sites are infiltrated with Marcaine one quarter plain.  Incisions are made with a scalpel.  All ports are inserted under direct vision.  2 robotic ports are inserted on the left side in line with the umbilicus.  1 robotic port is inserted on the left side distally in line with the umbilicus.  The assistant port is inserted on the left medially in line with the umbilicus.  The patient is positioned in 30 degree Trendelenburg, which she tolerates well.  The robot is docked.  Robotic instruments are inserted under direct vision with the fenestrated clamp in the fourth arm, the scissors in the third arm, the camera in the second arm and the long bipolar in the first arm.  Current is connected and we go to the console.      Further inspection confirms the findings above.  Both ureters are very easily seen in normal anatomy position with good peristalsis.  Pictures are taken.  We started on the right side with cauterization and section of the right mesosalpinx.  We cauterized and section the right utero-ovarian ligament.  We cauterized and section the right round ligament.  We started opening the visceral peritoneum anteriorly.  The bladder is lowered on that side  past the [facility] ring.  The filmy adhesions between the bowels and the left lower abdominal sidewall are lysed.  We proceeded exactly the same way on the left side.  We further lower the bladder well past the [facility] ring.  The uterine backflow was catheterized on either side of the uterus.  We then cauterized the right uterine artery and then the left uterine artery.  The left uterine artery was sectioned.  Then the right uterine artery was sectioned.  Good hemostasis.  The vaginal occluder  was inflated.  We used the tip of the scissors to open the upper vagina on the [place] ring anteriorly, then on each side and finally posteriorly.  The uterus was completely detached with the cervix and bilateral tubes.  The specimen was easily passed vaginally intact with the fibroid.  The specimen was sent to pathology.  The vaginal occluder was put back in place in the vagina.  We irrigated and suction the pelvic cavity and completed hemostasis with the bipolar where necessary.  The instruments were switched to the cutting needle driver in the third arm and the long tip in the first arm.  Current was added to the fenestrated clamp.  We used a V-Loc 0 at 9 inches to close the vaginal vault.  We started at the right angle and did a running suture all the way to the left angle and then back to the right.  The needle was removed from the abdomen.  Hemostasis was confirmed once more and was adequate.  Pictures were taken.  The robotic instruments were removed.  The robot was undocked.  We went to laparoscopy time.      The steep Trendelenburg was removed and we confirmed that no fluid needed to be suctioned.  The camera was removed.  All ports were removed.  The CO2 was evacuated.  The pursestring stitch was attached at the aponeurosis.  All incisions were closed with a subcuticular suture of Vicryl 4-0.  Dermabond was added on all incisions.  Hemostasis was adequate at all levels.  The occluder was removed from the vagina.  The patient was brought to recovery room in good and stable status.  ESTIMATED BLOOD LOSS: 25 mL   Intake/Output Summary (Last 24 hours) at 05/30/2022 1428 Last data filed at 05/30/2022 1400 Gross per 24 hour  Intake 100 ml  Output 125 ml  Net -25 ml     BLOOD ADMINISTERED:none   LOCAL MEDICATIONS USED:  MARCAINE     SPECIMEN:  Source of Specimen:  Uterus with cervix and bilateral tubes  DISPOSITION OF SPECIMEN:  PATHOLOGY  COUNTS:  YES  PLAN OF CARE: Transfer to PACU  Marie-Lyne  LavoieMD2:28 PM

## 2022-05-30 NOTE — Anesthesia Procedure Notes (Addendum)
Procedure Name: Intubation Date/Time: 05/30/2022 12:30 PM  Performed by: Georgeanne Nim, CRNAPre-anesthesia Checklist: Patient identified, Patient being monitored, Timeout performed, Emergency Drugs available and Suction available Patient Re-evaluated:Patient Re-evaluated prior to induction Oxygen Delivery Method: Circle System Utilized Preoxygenation: Pre-oxygenation with 100% oxygen Induction Type: IV induction Ventilation: Mask ventilation without difficulty Laryngoscope Size: Mac and 3 Grade View: Grade I Tube type: Oral Tube size: 7.0 mm Number of attempts: 1 Airway Equipment and Method: stylet Placement Confirmation: ETT inserted through vocal cords under direct vision, positive ETCO2 and breath sounds checked- equal and bilateral Secured at: 21 cm Tube secured with: Tape Dental Injury: Teeth and Oropharynx as per pre-operative assessment

## 2022-05-30 NOTE — Progress Notes (Signed)
Percocet for Post op pain control sent to pharmacy.

## 2022-05-30 NOTE — Transfer of Care (Signed)
Immediate Anesthesia Transfer of Care Note  Patient: Connie Wheeler  Procedure(s) Performed: XI ROBOTIC ASSISTED Total LAPAROSCOPIC HYSTERECTOMY AND SALPINGECTOMY (Bilateral: Abdomen)  Patient Location: PACU  Anesthesia Type:General  Level of Consciousness: awake, alert , oriented, and patient cooperative  Airway & Oxygen Therapy: Patient Spontanous Breathing and Patient connected to nasal cannula oxygen  Post-op Assessment: Report given to RN and Post -op Vital signs reviewed and stable  Post vital signs: Reviewed and stable  Last Vitals:  Vitals Value Taken Time  BP 106/68 05/30/22 1431  Temp 36.7 C 05/30/22 1430  Pulse 77 05/30/22 1436  Resp 10 05/30/22 1436  SpO2 100 % 05/30/22 1436  Vitals shown include unvalidated device data.  Last Pain:  Vitals:   05/30/22 0933  TempSrc: Oral  PainSc: 0-No pain      Patients Stated Pain Goal: 7 ([id number])  Complications: No notable events documented.

## 2022-05-30 NOTE — Progress Notes (Signed)
Called to room by family of pt, pt c/o SOB, states "something's not right", severely anxious and c/o acute, sudden, sharp pain to upper abdominal area below R rib cage. Abdomen soft, non-tender, incisions C/D/I, no bleeding to peripad, O2 sat 100% RA, HR 78. Assisted pt with ambulation in hallway, pt with immediate pain relief and no further SOB, pt reports relief of sudden gas pain. Assisted to recliner, family at bedside, will continue to monitor.   Lyndel Pleasure, RN

## 2022-05-30 NOTE — Anesthesia Postprocedure Evaluation (Signed)
Anesthesia Post Note  Patient: Connie Wheeler  Procedure(s) Performed: XI ROBOTIC ASSISTED Total LAPAROSCOPIC HYSTERECTOMY AND SALPINGECTOMY (Bilateral: Abdomen)     Patient location during evaluation: PACU Anesthesia Type: General Level of consciousness: awake and alert, oriented and patient cooperative Pain management: pain level controlled Vital Signs Assessment: post-procedure vital signs reviewed and stable Respiratory status: spontaneous breathing, nonlabored ventilation and respiratory function stable Cardiovascular status: blood pressure returned to baseline and stable Postop Assessment: no apparent nausea or vomiting Anesthetic complications: no   No notable events documented.  Last Vitals:  Vitals:   05/30/22 1619 05/30/22 1704  BP: 123/70 (!) 137/91  Pulse: 78 88  Resp: 13 16  Temp:  36.8 C  SpO2: 99% 97%    Last Pain:  Vitals:   05/30/22 1650  TempSrc:   PainSc: [place]

## 2022-05-31 ENCOUNTER — Encounter (HOSPITAL_BASED_OUTPATIENT_CLINIC_OR_DEPARTMENT_OTHER): Payer: Self-pay | Admitting: Obstetrics & Gynecology

## 2022-05-31 DIAGNOSIS — D259 Leiomyoma of uterus, unspecified: Secondary | ICD-10-CM | POA: Diagnosis not present

## 2022-05-31 MED ORDER — OXYCODONE HCL 5 MG PO TABS
ORAL_TABLET | ORAL | Status: AC
  Filled 2022-05-31: qty 2

## 2022-05-31 MED ORDER — HYDROCODONE-ACETAMINOPHEN 5-325 MG PO TABS
ORAL_TABLET | ORAL | Status: AC
  Filled 2022-05-31: qty 1

## 2022-05-31 MED ORDER — OXYCODONE HCL 5 MG PO TABS
10.0000 mg | ORAL_TABLET | Freq: Once | ORAL | Status: AC
  Administered 2022-05-31: 10 mg via ORAL

## 2022-05-31 MED ORDER — IBUPROFEN 800 MG PO TABS
ORAL_TABLET | ORAL | Status: AC
  Filled 2022-05-31: qty 1

## 2022-05-31 MED ORDER — IBUPROFEN 200 MG PO TABS: 800.0000 mg | ORAL_TABLET | Freq: Three times a day (TID) | ORAL | 0 refills | Status: AC | PRN

## 2022-05-31 MED ORDER — IBUPROFEN 800 MG PO TABS
800.0000 mg | ORAL_TABLET | Freq: Three times a day (TID) | ORAL | Status: DC | PRN
  Administered 2022-05-31: 800 mg via ORAL

## 2022-05-31 NOTE — Discharge Summary (Signed)
Physician Discharge Summary  Patient ID: Connie Wheeler MRN: [id number] DOB/AGE: 08/18/1970 52 y.o.  Admit date: 05/30/2022 Discharge date: 05/31/2022  Admission Diagnoses: Postoperative state [Z98.890]   Discharge Diagnoses:  Principal Problem:   Postoperative state   Discharged Condition: good  Consults:None  Significant Diagnostic Studies: labs: Hb normal  Treatments:surgery: XI Robotic Total Laparoscopic Hysterectomy with Bilateral Salpingectomy, Lysis of Adhesions  Vitals:   05/31/22 0600 05/31/22 0845  BP: 108/77 109/71  Pulse: 70 76  Resp: 14 14  Temp:  97.7 F (36.5 C)  SpO2: 98% 99%     No intake/output data recorded.   Hospital Course: Good  Discharge Exam: Normal Postop  Disposition: Discharge disposition: 01-Home or Self Care      Discharge Instructions     Discharge patient   Complete by: As directed    Discharge disposition: 01-Home or Self Care   Discharge patient date: 05/31/2022        Allergies as of 05/31/2022       Reactions   Tape    Per pt  "any adhesive tape of any type with strong adhesive it rips skin off and cause a burn"        Medication List     STOP taking these medications    megestrol 20 MG tablet Commonly known as: MEGACE       TAKE these medications    ALBUTEROL IN Inhale into the lungs as needed.   aspirin-acetaminophen-caffeine 250-250-65 MG tablet Commonly known as: EXCEDRIN MIGRAINE Take by mouth every 6 (six) hours as needed for headache.   hydrOXYzine 25 MG capsule Commonly known as: VISTARIL Take 25 mg by mouth every evening.   ibuprofen 200 MG tablet Commonly known as: ADVIL Take 4 tablets (800 mg total) by mouth every 8 (eight) hours as needed for mild pain, moderate pain or cramping. What changed: when to take this Notes to patient: Next dose is due at 2 pm    oxyCODONE-acetaminophen 7.5-325 MG tablet Commonly known as: Percocet Take 1 tablet by mouth every 6 (six) hours as needed  for severe pain. Postop pain control. Notes to patient: Next dose is due at 3 pm as needed for pain.   pentosan polysulfate 100 MG capsule Commonly known as: ELMIRON Take 200 mg by mouth at bedtime.   traZODone 100 MG tablet Commonly known as: DESYREL Take 100 mg by mouth at bedtime.           Signed: Princess Bruins 05/31/2022, 5:20 PM

## 2022-05-31 NOTE — Progress Notes (Signed)
POD#1 XI Robotic TLH/BS  Subjective: Patient reports tolerating PO and no problems voiding.    Objective: I have reviewed patient's vital signs.  vital signs, intake and output, medications, and labs.  Vitals:   05/31/22 0200 05/31/22 0600  BP: 106/73 108/77  Pulse: 84 70  Resp: 16 14  Temp: 98.4 F (36.9 C)   SpO2: 100% 98%   I/O last 3 completed shifts: In: 3371.3 [P.O.:1440; I.V.:1831.3; IV Piggyback:100] Out: 2875 [Urine:2850; Blood:25] No intake/output data recorded.  Results for orders placed or performed during the hospital encounter of 05/30/22 (from the past 24 hour(s))  Pregnancy, urine POC     Status: None   Collection Time: 05/30/22  9:10 AM  Result Value Ref Range   Preg Test, Ur NEGATIVE NEGATIVE  CBC     Status: Abnormal   Collection Time: 05/30/22  4:10 PM  Result Value Ref Range   WBC 16.8 (H) 4.0 - 10.5 K/uL   RBC 5.08 3.87 - 5.11 MIL/uL   Hemoglobin 15.1 (H) 12.0 - 15.0 g/dL   HCT 47.3 (H) 36.0 - 46.0 %   MCV 93.1 80.0 - 100.0 fL   MCH 29.7 26.0 - 34.0 pg   MCHC 31.9 30.0 - 36.0 g/dL   RDW 12.5 11.5 - 15.5 %   Platelets 204 150 - 400 K/uL   nRBC 0.0 0.0 - 0.2 %    EXAM General: alert, cooperative, and appears stated age GI: incision: clean, dry, and intact.  Soft, not distended. Extremities: Homans sign is negative, no sign of DVT Vaginal Bleeding: none  Assessment: s/p Procedure(s): XI ROBOTIC ASSISTED Total LAPAROSCOPIC HYSTERECTOMY AND SALPINGECTOMY: progressing well and tolerating diet  Plan: Discontinue IV fluids Discharge home  LOS: 0 days    Princess Bruins, MD 05/31/2022 8:10 AM

## 2022-06-01 LAB — SURGICAL PATHOLOGY

## 2022-06-04 ENCOUNTER — Telehealth: Payer: Self-pay | Admitting: *Deleted

## 2022-06-04 ENCOUNTER — Telehealth: Payer: Self-pay | Admitting: Obstetrics and Gynecology

## 2022-06-04 NOTE — Telephone Encounter (Signed)
LATE ENTRY  Spoke with patient 4/6. POD#3 s/p RA-TLH, bs, loa. ID confirmed x2.  Reports burning sensation below level of umbilical incision. No fever, chills, abnormal discharge or changes to the incision. Noticed mostly with movement. No overlying skin changes.   Advised continue routine postop medications. Can apply heat or ice to area. Likely related to fascial closure of incision. Reviewed signs/symptoms to prompt immediate presentation to care.   Lorriane Shire, MD

## 2022-06-04 NOTE — Telephone Encounter (Signed)
Call to patient to review surgery path per Dr. Seymour Bars.   S/P Robot TLH with BS on 05/30/22.  Patient reports burning sensation with movement in left lower right abdomen/pelvis approximately 1 inch below incision. Called on call provider over the weekend, states she was advised to continue to monitor, symptoms have been improving with position changes and heat. Pain currently 4 out of 10. Denies vaginal bleeding, N/V, fever/chills. Incisions are closed and no redness, swelling or drainage. Scheduled for [ost-op with Dr. Seymour Bars 4/17. Advised to continue to monitor symptoms. If symptoms do not continue to improve or if new symptoms develop, return call to office to schedule OV. Advised patient I will forward to Dr. Seymour Bars to review and f/u if any additional recommendations. Patient agreeable.   Routing to Dr. Seymour Bars

## 2022-06-06 ENCOUNTER — Encounter: Payer: Self-pay | Admitting: *Deleted

## 2022-06-06 ENCOUNTER — Telehealth: Payer: Self-pay

## 2022-06-06 NOTE — Telephone Encounter (Signed)
Pt calling to report continued "burns" from possible adhesives placed on her during surgery performed on 05/30/2022. States she was told about a drape placed on her during surgery and she is not quite sure if there was any tape that was placed around drape to hold it in place but areas of concern are under her breasts, on her side, and legs (possibly where drape edges could have been). States areas seem more rashy than anything. Per allergy list: Tape-reaction states "strong adhesives can rip skin off and cause a burn."  Pt states that she hadn't tried anything OTC yet (assumed that if areas were going to resolve, then they would have by now)  and wanted to inquire from [place] first. Please advise.

## 2022-06-06 NOTE — Telephone Encounter (Signed)
Per ML: "Recommend vaseline to dissolve any glue and then wipe off.  Can use OTC Hydroxycortisone 1% cream and Benadryl PO."  Pt notified and voiced understanding. Will let us know after OTC options in 24hours if no improvement or sxs get worse. Will route to provider for final review and close encounter.

## 2022-06-07 ENCOUNTER — Encounter: Payer: Self-pay | Admitting: Obstetrics & Gynecology

## 2022-06-07 NOTE — Telephone Encounter (Signed)
Per ML: "At this time, just 8 days postop, I recommend not applying at the incisions."   Pt made aware that no returned call meant to avoid incision sites and that will be addressed at first post op appt. Pt voiced understanding. Will close encounter.

## 2022-06-07 NOTE — Telephone Encounter (Signed)
FYI. Pt wanted to confirm that she should not put the vaseline in or on incision sites, just around? Please confirm. Thanks.

## 2022-06-08 ENCOUNTER — Telehealth: Payer: Self-pay

## 2022-06-08 NOTE — Telephone Encounter (Signed)
Per encounter from 06/06/2022: Pt LVM in triage line calling to report rash still being present after taking benadryl and applying OTC hydrocortisone cream.  Please advise.

## 2022-06-11 ENCOUNTER — Ambulatory Visit (INDEPENDENT_AMBULATORY_CARE_PROVIDER_SITE_OTHER): Payer: Self-pay | Admitting: Plastic Surgery

## 2022-06-11 ENCOUNTER — Encounter (INDEPENDENT_AMBULATORY_CARE_PROVIDER_SITE_OTHER): Payer: Self-pay

## 2022-06-11 ENCOUNTER — Encounter: Payer: Self-pay | Admitting: Plastic Surgery

## 2022-06-11 ENCOUNTER — Encounter: Payer: Self-pay | Admitting: *Deleted

## 2022-06-11 VITALS — BP 122/78 | HR 74 | Ht 66.0 in | Wt 151.0 lb

## 2022-06-11 DIAGNOSIS — Z719 Counseling, unspecified: Secondary | ICD-10-CM

## 2022-06-11 NOTE — Telephone Encounter (Signed)
Connie Piccolo, MD  Selinda Eon, CMA2 days ago    Observation, as it may take a long time.  If worsens, consider Prednisone 10 day pack. Dr Seymour Bars

## 2022-06-11 NOTE — Progress Notes (Signed)
Patient ID: Connie Wheeler, female    DOB: 11-Aug-1970, 52 y.o.   MRN: [id number]   Chief Complaint  Patient presents with   Consult    The patient is a 52 yrs old female here for evaluation of her face.  The patient has some questions about her upper lids.  She is noticing drooping that is getting worse over time.  The right is worse than the left.  On exam she is got a little bit of ptosis of the brows a little bit of dermatochalasis but also I am worried that she might have a little bit of upper lid ptosis.    Review of Systems  Constitutional:  Positive for activity change.  HENT: Negative.    Eyes: Negative.   Respiratory: Negative.    Cardiovascular: Negative.   Endocrine: Negative.   Genitourinary: Negative.   Musculoskeletal: Negative.     Past Medical History:  Diagnosis Date   Chronic interstitial cystitis    urologist--- dr Elvera Lennox. [facility]    (03/ 2024  per pt in remission on elmiron)   Exercise-induced asthma    Family history of adverse reaction to anesthesia    mother---  very hard to wake   GAD (generalized anxiety disorder)    IDA (iron deficiency anemia)    Intramural uterine fibroid    Lower urinary tract symptoms (LUTS)    MDD (major depressive disorder)    Menometrorrhagia    Migraines    Pelvic pain in female    Septate uterus     Past Surgical History:  Procedure Laterality Date   DILITATION & CURRETTAGE/HYSTROSCOPY WITH HYDROTHERMAL ABLATION N/A 12/20/2021   Procedure: HYSTEROSCOPY WITH HYDROTHERMAL ABLATION;  Surgeon: Shea Evans, MD;  Location: [facility];  Service: Gynecology;  Laterality: N/A;   KNEE ARTHROSCOPY Right 2006   MASS EXCISION Left 2001   thigh  (benign)   ROBOTIC ASSISTED LAPAROSCOPIC HYSTERECTOMY AND SALPINGECTOMY Bilateral 05/30/2022   Procedure: XI ROBOTIC ASSISTED Total LAPAROSCOPIC HYSTERECTOMY AND SALPINGECTOMY;  Surgeon: Genia Del, MD;  Location: [facility] ;  Service: Gynecology;   Laterality: Bilateral;      Current Outpatient Medications:    ALBUTEROL IN, Inhale into the lungs as needed., Disp: , Rfl:    aspirin-acetaminophen-caffeine (EXCEDRIN MIGRAINE) 250-250-65 MG tablet, Take by mouth every 6 (six) hours as needed for headache., Disp: , Rfl:    hydrOXYzine (VISTARIL) 25 MG capsule, Take 25 mg by mouth every evening., Disp: , Rfl:    ibuprofen (ADVIL) 200 MG tablet, Take 4 tablets (800 mg total) by mouth every 8 (eight) hours as needed for mild pain, moderate pain or cramping., Disp: 30 tablet, Rfl: 0   oxyCODONE-acetaminophen (PERCOCET) 7.5-325 MG tablet, Take 1 tablet by mouth every 6 (six) hours as needed for severe pain. Postop pain control., Disp: 20 tablet, Rfl: 0   pentosan polysulfate (ELMIRON) 100 MG capsule, Take 200 mg by mouth at bedtime., Disp: , Rfl:    traZODone (DESYREL) 100 MG tablet, Take 100 mg by mouth at bedtime., Disp: , Rfl:    Objective:   There were no vitals filed for this visit.  Physical Exam Constitutional:      Appearance: Normal appearance.  Cardiovascular:     Rate and Rhythm: Normal rate.  Skin:    Capillary Refill: Capillary refill takes less than 2 seconds.     Coloration: Skin is not jaundiced.     Findings: No bruising or lesion.  Neurological:  Mental Status: She is alert and oriented to person, place, and time.  Psychiatric:        Mood and Affect: Mood normal.        Behavior: Behavior normal.        Thought Content: Thought content normal.        Judgment: Judgment normal.     Assessment & Plan:  Encounter for counseling  I would like the patient to see Dr. Dimas Millin to be evaluated for her upper lids.  This would likely include a visual field exam.  I remain available for Bleph and forehead if she decides and does not need the ptosis surgery.  I will also send her the information for the BBL and the halo.  Will get her into see Lily for a consult.  Pictures were obtained of the patient and placed in the  chart with the patient's or guardian's permission.   Connie Wheeler Connie Goldwasser, DO

## 2022-06-11 NOTE — Telephone Encounter (Signed)
Spoke with patient and advised her. She said it has not worsened and she will continue to observe.

## 2022-06-12 ENCOUNTER — Encounter: Payer: Managed Care, Other (non HMO) | Admitting: Plastic Surgery

## 2022-06-12 NOTE — Telephone Encounter (Signed)
Dr. Seymour Bars -ok to close encounter?

## 2022-06-13 ENCOUNTER — Ambulatory Visit: Payer: Managed Care, Other (non HMO) | Admitting: Obstetrics & Gynecology

## 2022-06-13 ENCOUNTER — Encounter: Payer: Self-pay | Admitting: Obstetrics & Gynecology

## 2022-06-13 VITALS — BP 112/70 | HR 94

## 2022-06-13 DIAGNOSIS — Z09 Encounter for follow-up examination after completed treatment for conditions other than malignant neoplasm: Secondary | ICD-10-CM

## 2022-06-13 NOTE — Telephone Encounter (Signed)
Genia Del, MD  You19 hours ago (12:25 PM)    Yes  Encounter closed.

## 2022-06-13 NOTE — Progress Notes (Signed)
    Connie Wheeler 07-28-1970 [id number]        52 y.o.  G2P0202   RP: Postop XI Robotic TLH/BS on 05/30/22  HPI: XI robotic assisted total laparoscopic hysterectomy and bilateral salpingectomy, lysis of filmy adhesions between bowl & left lower abdominal side wall on 05/30/22.  Allergy to glue on the drape, resolved now.  Incisions well closed, no redness. Rt lower abdominal wall pain with movement. No abdominopelvic pain. Urine/BMs normal.  No fever.   OB History  Gravida Para Term Preterm AB Living  SAB IAB Ectopic Multiple Live Births          2    # Outcome Date GA Lbr Len/2nd Weight Sex Delivery Anes PTL Lv  2 Preterm           1 Preterm             Past medical history,surgical history, problem list, medications, allergies, family history and social history were all reviewed and documented in the EPIC chart.   Directed ROS with pertinent positives and negatives documented in the history of present illness/assessment and plan.  Exam:  Vitals:   06/13/22 1348  BP: 112/70  Pulse: 94  SpO2: 98%   General appearance:  Normal  Abdomen: Incisions well closed, no erythema, no induration.  Soft, not distended.  BS present.  Tender at the Rt lower abdominal wall, even with light touch, nerve type pain.  Gynecologic exam: Vulva normal.  Speculum:  Vaginal vault well closed.  No blood, normal secretions.   Assessment/Plan:  52 y.o. [id number]   1. Status post gynecological surgery, follow-up exam  XI robotic assisted total laparoscopic hysterectomy and bilateral salpingectomy, lysis of filmy adhesions between bowl & left lower abdominal side wall on 05/30/22.  Allergy to glue on the drape, resolved now.  Incisions well closed, no redness. Rt lower abdominal wall pain with movement. No abdominopelvic pain. Urine/BMs normal.  No fever.  Healing well, no Cx.  Precautions reviewed.  F/U at 6 wks postop.  Genia Del MD, 1:54 PM 06/13/2022

## 2022-06-19 ENCOUNTER — Encounter: Payer: Self-pay | Admitting: Obstetrics & Gynecology

## 2022-06-19 NOTE — Telephone Encounter (Signed)
Dr. Seymour Bars -do you have a preference for post op incisions to reduce scarring?

## 2022-06-19 NOTE — Telephone Encounter (Signed)
Genia Del, MD  You11 minutes ago (1:54 PM)    Mederma gel. Dr Elbert Ewings  Message to patient.   Encounter closed.

## 2022-06-25 ENCOUNTER — Encounter (INDEPENDENT_AMBULATORY_CARE_PROVIDER_SITE_OTHER): Payer: Self-pay

## 2022-06-25 DIAGNOSIS — Z719 Counseling, unspecified: Secondary | ICD-10-CM

## 2022-06-26 ENCOUNTER — Encounter: Payer: Self-pay | Admitting: Obstetrics & Gynecology

## 2022-07-09 ENCOUNTER — Ambulatory Visit (INDEPENDENT_AMBULATORY_CARE_PROVIDER_SITE_OTHER): Payer: Managed Care, Other (non HMO) | Admitting: Obstetrics & Gynecology

## 2022-07-09 ENCOUNTER — Encounter: Payer: Self-pay | Admitting: Obstetrics & Gynecology

## 2022-07-09 VITALS — BP 136/84 | HR 77 | Resp 20

## 2022-07-09 DIAGNOSIS — L089 Local infection of the skin and subcutaneous tissue, unspecified: Secondary | ICD-10-CM

## 2022-07-09 DIAGNOSIS — Z09 Encounter for follow-up examination after completed treatment for conditions other than malignant neoplasm: Secondary | ICD-10-CM

## 2022-07-09 DIAGNOSIS — R5383 Other fatigue: Secondary | ICD-10-CM

## 2022-07-09 MED ORDER — CLOBETASOL PROPIONATE 0.05 % EX CREA: 1.0000 | TOPICAL_CREAM | Freq: Two times a day (BID) | CUTANEOUS | 0 refills | Status: AC

## 2022-07-09 NOTE — Progress Notes (Signed)
    Connie Wheeler [birth year] [id number]        52 y.o.  G2P0202   RP:  Postop XI Robotic TLH/BS on 05/30/22   HPI: XI robotic assisted total laparoscopic hysterectomy and bilateral salpingectomy, lysis of filmy adhesions between bowels & left lower abdominal side wall on 05/30/22.  Allergy to glue on the drape, resolved.  Incisions well closed, no redness, except at the Rt medial one.  No abdominopelvic pain. No vaginal bleeding.  Urine/BMs normal.  No fever.    OB History  Gravida Para Term Preterm AB Living  2 2   2   2   SAB IAB Ectopic Multiple Live Births          2    # Outcome Date GA Lbr Len/2nd Weight Sex Delivery Anes PTL Lv  2 Preterm           1 Preterm             Past medical history,surgical history, problem list, medications, allergies, family history and social history were all reviewed and documented in the EPIC chart.   Directed ROS with pertinent positives and negatives documented in the history of present illness/assessment and plan.  Exam:  Vitals:   07/09/22 1529  BP: 136/84  Pulse: 77  Resp: 20  SpO2: 98%   General appearance:  Normal  Abdomen: Incisions well closed.  No sign of infection.  Rt medial incision with mild skin inflammation (probable allergy to glue).  Gynecologic exam: Vulva normal.  Bimanual exam:  Vaginal vault well healed, closed, no induration, non-tender.  No pelvic mass felt.  No d/c, no blood.   Assessment/Plan:  52 y.o. [id number]   1. Status post gynecological surgery, follow-up exam XI robotic assisted total laparoscopic hysterectomy and bilateral salpingectomy, lysis of filmy adhesions between bowels & left lower abdominal side wall on 05/30/22.  Allergy to glue on the drape, resolved.  Incisions well closed, no redness, except at the Rt medial one.  No abdominopelvic pain. No vaginal bleeding.  Urine/BMs normal.  No fever.   2. Fatigue, unspecified type R/O anemia, CBC today. - CBC  3. Skin inflammation Inflammation at  the Rt medial incision, probable allergy to the glue. No sign of infection, well closed.  Will apply Clobetasol 0.05% cream BID x 2 weeks, then wean.  Usage reviewed, prescription sent to pharmacy.  Other orders - clobetasol cream (TEMOVATE) 0.05 %; Apply 1 Application topically 2 (two) times daily for 14 days. Then wean progressively.   Genia Del MD, 3:37 PM 07/09/2022

## 2022-07-10 LAB — CBC
HCT: 41.5 % (ref 35.0–45.0)
Hemoglobin: 13.5 g/dL (ref 11.7–15.5)
MCH: 29.2 pg (ref 27.0–33.0)
MCHC: 32.5 g/dL (ref 32.0–36.0)
MCV: 89.6 fL (ref 80.0–100.0)
MPV: 10.8 fL (ref 7.5–12.5)
Platelets: 317 10*3/uL (ref 140–400)
RBC: 4.63 10*6/uL (ref 3.80–5.10)
RDW: 12.3 % (ref 11.0–15.0)
WBC: 9.6 10*3/uL (ref 3.8–10.8)

## 2022-07-19 ENCOUNTER — Encounter: Payer: Self-pay | Admitting: Obstetrics & Gynecology

## 2022-07-19 ENCOUNTER — Ambulatory Visit: Payer: Managed Care, Other (non HMO) | Admitting: Obstetrics & Gynecology

## 2022-07-19 VITALS — BP 108/64 | HR 92

## 2022-07-19 DIAGNOSIS — Z23 Encounter for immunization: Secondary | ICD-10-CM

## 2022-07-19 DIAGNOSIS — Z09 Encounter for follow-up examination after completed treatment for conditions other than malignant neoplasm: Secondary | ICD-10-CM

## 2022-07-19 NOTE — Progress Notes (Signed)
    Connie Wheeler 10-08-1970 [id number]        52 y.o.  G2P0202 Married  RP: Postop XI robotic assisted total laparoscopic hysterectomy and bilateral salpingectomy, lysis of filmy adhesions between bowl & left lower abdominal side wall on 05/30/22  HPI: Good postop healing after XI robotic assisted total laparoscopic hysterectomy and bilateral salpingectomy, lysis of filmy adhesions between bowl & left lower abdominal side wall on 05/30/22.  Rt medial incision with less erythema d/t allergy to glue since last week when started applying Clobetasol.  No pelvic pain.  No vaginal bleeding.  No fever.   OB History  Gravida Para Term Preterm AB Living  2 2   2   2   SAB IAB Ectopic Multiple Live Births          2    # Outcome Date GA Lbr Len/2nd Weight Sex Delivery Anes PTL Lv  2 Preterm           1 Preterm             Past medical history,surgical history, problem list, medications, allergies, family history and social history were all reviewed and documented in the EPIC chart.   Directed ROS with pertinent positives and negatives documented in the history of present illness/assessment and plan.  Exam:  Vitals:   07/19/22 1022  BP: 108/64  Pulse: 92  SpO2: 98%   General appearance:  Normal  Abdomen: All incisions well closed.  No induration.  Less erythema at the Rt medial incision.  Gynecologic exam: Deferred   Assessment/Plan:  52 y.o. [id number]   1. Status post gynecological surgery, follow-up exam Well healed.  Will clean the Rt medial incision with soap to make sure all the glue has been removed.  Use Clobetasol cream until erythema is completely resolved.  Resume sexual IC at 8 weeks.  2. Need for Tdap vaccination - Tdap vaccine greater than or equal to 7yo IM   Genia Del MD, 10:26 AM 07/19/2022

## 2022-09-28 DIAGNOSIS — Z719 Counseling, unspecified: Secondary | ICD-10-CM

## 2022-12-12 DIAGNOSIS — Z719 Counseling, unspecified: Secondary | ICD-10-CM

## 2023-01-18 DIAGNOSIS — Z719 Counseling, unspecified: Secondary | ICD-10-CM

## 2023-05-15 DIAGNOSIS — Z1231 Encounter for screening mammogram for malignant neoplasm of breast: Secondary | ICD-10-CM | POA: Diagnosis not present

## 2023-05-15 DIAGNOSIS — Z01419 Encounter for gynecological examination (general) (routine) without abnormal findings: Secondary | ICD-10-CM | POA: Diagnosis not present

## 2023-06-13 DIAGNOSIS — N3010 Interstitial cystitis (chronic) without hematuria: Secondary | ICD-10-CM | POA: Diagnosis not present

## 2023-06-13 DIAGNOSIS — H02413 Mechanical ptosis of bilateral eyelids: Secondary | ICD-10-CM | POA: Diagnosis not present

## 2023-06-13 DIAGNOSIS — H02831 Dermatochalasis of right upper eyelid: Secondary | ICD-10-CM | POA: Diagnosis not present

## 2023-06-13 DIAGNOSIS — Z6827 Body mass index (BMI) 27.0-27.9, adult: Secondary | ICD-10-CM | POA: Diagnosis not present

## 2023-06-13 DIAGNOSIS — E663 Overweight: Secondary | ICD-10-CM | POA: Diagnosis not present

## 2023-06-13 DIAGNOSIS — H0279 Other degenerative disorders of eyelid and periocular area: Secondary | ICD-10-CM | POA: Diagnosis not present

## 2023-06-13 DIAGNOSIS — F5101 Primary insomnia: Secondary | ICD-10-CM | POA: Diagnosis not present

## 2023-07-01 DIAGNOSIS — N3010 Interstitial cystitis (chronic) without hematuria: Secondary | ICD-10-CM | POA: Diagnosis not present

## 2023-07-01 DIAGNOSIS — E663 Overweight: Secondary | ICD-10-CM | POA: Diagnosis not present

## 2023-07-01 DIAGNOSIS — Z6827 Body mass index (BMI) 27.0-27.9, adult: Secondary | ICD-10-CM | POA: Diagnosis not present

## 2023-07-01 DIAGNOSIS — F5101 Primary insomnia: Secondary | ICD-10-CM | POA: Diagnosis not present

## 2023-07-16 DIAGNOSIS — Z6827 Body mass index (BMI) 27.0-27.9, adult: Secondary | ICD-10-CM | POA: Diagnosis not present

## 2023-07-16 DIAGNOSIS — F5101 Primary insomnia: Secondary | ICD-10-CM | POA: Diagnosis not present

## 2023-07-16 DIAGNOSIS — E663 Overweight: Secondary | ICD-10-CM | POA: Diagnosis not present

## 2023-07-16 DIAGNOSIS — N3010 Interstitial cystitis (chronic) without hematuria: Secondary | ICD-10-CM | POA: Diagnosis not present

## 2023-07-30 DIAGNOSIS — E663 Overweight: Secondary | ICD-10-CM | POA: Diagnosis not present

## 2023-07-30 DIAGNOSIS — F5101 Primary insomnia: Secondary | ICD-10-CM | POA: Diagnosis not present

## 2023-07-30 DIAGNOSIS — Z9189 Other specified personal risk factors, not elsewhere classified: Secondary | ICD-10-CM | POA: Diagnosis not present

## 2023-07-30 DIAGNOSIS — N3010 Interstitial cystitis (chronic) without hematuria: Secondary | ICD-10-CM | POA: Diagnosis not present

## 2023-08-20 DIAGNOSIS — F5101 Primary insomnia: Secondary | ICD-10-CM | POA: Diagnosis not present

## 2023-08-20 DIAGNOSIS — Z6825 Body mass index (BMI) 25.0-25.9, adult: Secondary | ICD-10-CM | POA: Diagnosis not present

## 2023-08-20 DIAGNOSIS — E663 Overweight: Secondary | ICD-10-CM | POA: Diagnosis not present

## 2023-08-20 DIAGNOSIS — N3010 Interstitial cystitis (chronic) without hematuria: Secondary | ICD-10-CM | POA: Diagnosis not present

## 2023-09-03 ENCOUNTER — Other Ambulatory Visit: Admitting: Plastic Surgery

## 2023-09-03 DIAGNOSIS — H02422 Myogenic ptosis of left eyelid: Secondary | ICD-10-CM | POA: Diagnosis not present

## 2023-09-03 DIAGNOSIS — H02412 Mechanical ptosis of left eyelid: Secondary | ICD-10-CM | POA: Diagnosis not present

## 2023-09-30 DIAGNOSIS — F5101 Primary insomnia: Secondary | ICD-10-CM | POA: Diagnosis not present

## 2023-09-30 DIAGNOSIS — E663 Overweight: Secondary | ICD-10-CM | POA: Diagnosis not present

## 2023-09-30 DIAGNOSIS — Z6824 Body mass index (BMI) 24.0-24.9, adult: Secondary | ICD-10-CM | POA: Diagnosis not present

## 2023-09-30 DIAGNOSIS — N3010 Interstitial cystitis (chronic) without hematuria: Secondary | ICD-10-CM | POA: Diagnosis not present

## 2023-11-06 DIAGNOSIS — B349 Viral infection, unspecified: Secondary | ICD-10-CM | POA: Diagnosis not present

## 2023-11-06 DIAGNOSIS — H9202 Otalgia, left ear: Secondary | ICD-10-CM | POA: Diagnosis not present

## 2023-11-06 DIAGNOSIS — M5431 Sciatica, right side: Secondary | ICD-10-CM | POA: Diagnosis not present

## 2023-11-13 DIAGNOSIS — F5101 Primary insomnia: Secondary | ICD-10-CM | POA: Diagnosis not present

## 2023-11-13 DIAGNOSIS — E663 Overweight: Secondary | ICD-10-CM | POA: Diagnosis not present

## 2023-11-13 DIAGNOSIS — Z6824 Body mass index (BMI) 24.0-24.9, adult: Secondary | ICD-10-CM | POA: Diagnosis not present

## 2023-11-13 DIAGNOSIS — N3010 Interstitial cystitis (chronic) without hematuria: Secondary | ICD-10-CM | POA: Diagnosis not present

## 2023-11-14 DIAGNOSIS — Z1331 Encounter for screening for depression: Secondary | ICD-10-CM | POA: Diagnosis not present

## 2023-11-14 DIAGNOSIS — S39012A Strain of muscle, fascia and tendon of lower back, initial encounter: Secondary | ICD-10-CM | POA: Diagnosis not present

## 2024-01-16 DIAGNOSIS — E663 Overweight: Secondary | ICD-10-CM | POA: Diagnosis not present

## 2024-01-16 DIAGNOSIS — Z6824 Body mass index (BMI) 24.0-24.9, adult: Secondary | ICD-10-CM | POA: Diagnosis not present

## 2024-01-16 DIAGNOSIS — F5101 Primary insomnia: Secondary | ICD-10-CM | POA: Diagnosis not present

## 2024-01-16 DIAGNOSIS — N3010 Interstitial cystitis (chronic) without hematuria: Secondary | ICD-10-CM | POA: Diagnosis not present

## 2024-02-18 DIAGNOSIS — Z13228 Encounter for screening for other metabolic disorders: Secondary | ICD-10-CM | POA: Diagnosis not present

## 2024-02-18 DIAGNOSIS — F32A Depression, unspecified: Secondary | ICD-10-CM | POA: Diagnosis not present

## 2024-02-18 DIAGNOSIS — J4540 Moderate persistent asthma, uncomplicated: Secondary | ICD-10-CM | POA: Diagnosis not present

## 2024-02-18 DIAGNOSIS — Z13220 Encounter for screening for lipoid disorders: Secondary | ICD-10-CM | POA: Diagnosis not present

## 2024-02-18 DIAGNOSIS — N3010 Interstitial cystitis (chronic) without hematuria: Secondary | ICD-10-CM | POA: Diagnosis not present

## 2024-02-18 DIAGNOSIS — Z0000 Encounter for general adult medical examination without abnormal findings: Secondary | ICD-10-CM | POA: Diagnosis not present

## 2024-02-18 DIAGNOSIS — Z6823 Body mass index (BMI) 23.0-23.9, adult: Secondary | ICD-10-CM | POA: Diagnosis not present

## 2024-02-18 DIAGNOSIS — Z130 Encounter for screening for diseases of the blood and blood-forming organs and certain disorders involving the immune mechanism: Secondary | ICD-10-CM | POA: Diagnosis not present
